# Patient Record
Sex: Female | Born: 1935 | Race: White | Hispanic: No | Smoking: Never smoker
Health system: Southern US, Community
[De-identification: ages and names within clinical notes are randomized; demographics above are authoritative.]

## PROBLEM LIST (undated history)

## (undated) DIAGNOSIS — E079 Disorder of thyroid, unspecified: Secondary | ICD-10-CM

## (undated) DIAGNOSIS — C801 Malignant (primary) neoplasm, unspecified: Secondary | ICD-10-CM

## (undated) DIAGNOSIS — H3530 Unspecified macular degeneration: Secondary | ICD-10-CM

## (undated) DIAGNOSIS — C4490 Unspecified malignant neoplasm of skin, unspecified: Secondary | ICD-10-CM

## (undated) HISTORY — DX: Disorder of thyroid, unspecified: E07.9

## (undated) HISTORY — PX: OTHER SURGICAL HISTORY: SHX169

## (undated) HISTORY — DX: Unspecified malignant neoplasm of skin, unspecified: C44.90

## (undated) HISTORY — DX: Unspecified macular degeneration: H35.30

## (undated) HISTORY — PX: CHOLECYSTECTOMY: SHX55

## (undated) HISTORY — DX: Malignant (primary) neoplasm, unspecified: C80.1

## (undated) HISTORY — PX: COLONOSCOPY: SHX174

---

## 1982-01-02 DIAGNOSIS — C801 Malignant (primary) neoplasm, unspecified: Secondary | ICD-10-CM

## 1982-01-02 HISTORY — PX: OVARY SURGERY: SHX727

## 1982-01-02 HISTORY — PX: VAGINAL HYSTERECTOMY: SUR661

## 1982-01-02 HISTORY — DX: Malignant (primary) neoplasm, unspecified: C80.1

## 2006-07-10 ENCOUNTER — Ambulatory Visit: Payer: Self-pay | Admitting: Ophthalmology

## 2006-07-10 ENCOUNTER — Other Ambulatory Visit: Payer: Self-pay

## 2006-07-16 ENCOUNTER — Ambulatory Visit: Payer: Self-pay | Admitting: Ophthalmology

## 2008-06-01 ENCOUNTER — Emergency Department: Payer: Self-pay | Admitting: Emergency Medicine

## 2015-01-13 ENCOUNTER — Ambulatory Visit: Payer: Self-pay | Admitting: General Surgery

## 2015-01-14 ENCOUNTER — Encounter: Payer: Self-pay | Admitting: General Surgery

## 2015-01-14 ENCOUNTER — Ambulatory Visit (INDEPENDENT_AMBULATORY_CARE_PROVIDER_SITE_OTHER): Payer: Medicare Other | Admitting: General Surgery

## 2015-01-14 VITALS — BP 124/58 | HR 86 | Resp 16 | Ht 64.0 in | Wt 115.0 lb

## 2015-01-14 DIAGNOSIS — K5641 Fecal impaction: Secondary | ICD-10-CM

## 2015-01-14 MED ORDER — HYDROCORTISONE ACE-PRAMOXINE 1-1 % RE CREA: 1.0000 "application " | TOPICAL_CREAM | Freq: Two times a day (BID) | RECTAL | Status: DC

## 2015-01-14 NOTE — Progress Notes (Signed)
Patient ID: Traci Vaughan, female   DOB: 1918/02/18, 82 y.o.   MRN: [id number]  Chief Complaint  Patient presents with  . Abdominal Pain    HPI Traci Vaughan is a 82 y.o. female.  Here today for evaluation of abdominal pain for 1 week and diarrhea for 2 weeks.  She states she has a history of going between diarrhea and constipation. She states she has diarrhea about 4-6 times a day, pudding consistency. Guiac positive test yesterday. The abdominal pain is described as more in the lower back and rectal area. No vomiting. Denies family history of colon cancer. No xrays. Pt has been using laxatives, miralax and stool softeners for a long time I have reviewed the history of present illness with the patient.   HPI  Past Medical History  Diagnosis Date  . Macular degeneration   . Cancer [facility]) 1984    ovarian /Dr Lottie Rater [facility]  . Skin cancer   . Thyroid disease     Past Surgical History  Procedure Laterality Date  . Ovary surgery  1984    [facility]  . Vaginal hysterectomy  1984  . Colonoscopy      History reviewed. No pertinent family history.  Social History Social History  Substance Use Topics  . Smoking status: Never Smoker   . Smokeless tobacco: Never Used  . Alcohol Use: No    No Known Allergies  Current Outpatient Prescriptions  Medication Sig Dispense Refill  . atorvastatin (LIPITOR) 10 MG tablet Take 10 mg by mouth daily at 6 PM.     . Biotin 1 MG CAPS Take by mouth daily.    . brimonidine (ALPHAGAN) 0.15 % ophthalmic solution     . Calcium Carbonate-Vit D-Min (CALCIUM 1200 PO) Take by mouth.    . Cholecalciferol (VITAMIN D-3 PO) Take by mouth.    . diphenoxylate-atropine (LOMOTIL) 2.5-0.025 MG tablet     . docusate sodium (COLACE) 100 MG capsule Take 100 mg by mouth daily.    . felodipine (PLENDIL) 5 MG 24 hr tablet Take 5 mg by mouth daily.     Marland Kitchen FIBER COMPLETE PO Take by mouth 2 (two) times daily.    . Magnesium Hydroxide (PHILLIPS PO) Take by mouth.    .  Multiple Vitamins-Minerals (EYE VITAMINS PO) Take by mouth 2 (two) times daily.    . Multiple Vitamins-Minerals (ZINC PO) Take by mouth.    Marland Kitchen PREMARIN 0.625 MG tablet Take 0.625 mg by mouth daily.     Marland Kitchen SYNTHROID 75 MCG tablet Take 75 mcg by mouth every other day.     . timolol (TIMOPTIC) 0.5 % ophthalmic solution     . triamterene-hydrochlorothiazide (MAXZIDE-25) 37.5-25 MG tablet Take 1 tablet by mouth daily.     . pramoxine-hydrocortisone (ANALPRAM-HC) 1-1 % rectal cream Place 1 application rectally 2 (two) times daily. 30 g 0   No current facility-administered medications for this visit.    Review of Systems Review of Systems  Constitutional: Negative.   Respiratory: Negative.   Cardiovascular: Negative.   Gastrointestinal: Positive for abdominal pain and diarrhea. Negative for nausea and vomiting.    Blood pressure 124/58, pulse 86, resp. rate 16, height 5\' 4"  (1.626 m), weight 115 lb (52.164 kg).  Physical Exam Physical Exam  Constitutional: She is oriented to person, place, and time. She appears well-developed and well-nourished.  HENT:  Mouth/Throat: Oropharynx is clear and moist.  Eyes: Conjunctivae are normal. No scleral icterus.  Neck: Neck supple.  Cardiovascular:  Normal rate, regular rhythm and normal heart sounds.   Pulmonary/Chest: Effort normal and breath sounds normal.  Abdominal: Soft. There is no tenderness.  Genitourinary: Rectal exam shows external hemorrhoid ( 25-32mm mildly edematous skin tags at 7 ocl) and tenderness. Rectal exam shows no internal hemorrhoid, no fissure, no mass and anal tone normal.  fecal impaction noted. Very large ball of fecal material in rectum-cannot get above it.   Lymphadenopathy:    She has no cervical adenopathy.  Neurological: She is alert and oriented to person, place, and time.  Skin: Skin is warm and dry.  Psychiatric: Her behavior is normal.    Data Reviewed Progress notes. Labs from yesterday- CBC normal. Creat is 2.0,  BUN 31.  Assessment    Fecal impaction.      Plan    Patient needs enemas. Likely will need several over a 3-4 days. If unsuccessful will need manual disimpaction. This information was provided to Dr. Hall Busing- he will arrange for home health nursing Analpram cream sample and RX. Avoid stool softeners Mild laxative as needed    Follow up as needed.  PCP:  Benita Stabile This information has been scribed by Karie Fetch [place].     SANKAR,SEEPLAPUTHUR G 01/15/2015, 9:12 AM

## 2015-01-14 NOTE — Patient Instructions (Addendum)
The patient is aware to call back for any questions or concerns. Needs enema Avoid stool softeners Mild laxative as needed  Fecal Impaction A fecal impaction happens when there is a large, firm amount of stool (or feces) that cannot be passed. The impacted stool is usually in the rectum, which is the lowest part of the large bowel. The impacted stool can block the colon and cause significant problems. CAUSES  The longer stool stays in the rectum, the harder it gets. Anything that slows down your bowel movements can lead to fecal impaction, such as:  Constipation. This can be a long-standing (chronic) problem or can happen suddenly (acute).  Painful conditions of the rectum, such as hemorrhoids or anal fissures. The pain of these conditions can make you try to avoid having bowel movements.  Narcotic pain-relieving medicines, such as methadone, morphine, or codeine.  Not drinking enough fluids.  Inactivity and bed rest over long periods of time.  Diseases of the brain or nervous system that damage the nerves controlling the muscles of the intestines. SIGNS AND SYMPTOMS   Lack of normal bowel movements or changes in bowel patterns.  Sense of fullness in the rectum but unable to pass stool.  Pain or cramps in the abdominal area (often after meals).  Thin, watery discharge from the rectum. DIAGNOSIS  Your health care provider may suspect that you have a fecal impaction based on your symptoms and a physical exam. This will include an exam of your rectum. Sometimes X-rays or lab testing may be needed to confirm the diagnosis and to be sure there are no other problems.  TREATMENT   Initially an impaction can be removed manually. Using a gloved finger, your health care provider can remove hard stool from your rectum.  Medicine is sometimes needed. A suppository or enema can be given in the rectum to soften the stool, which can stimulate a bowel movement. Medicines can also be given by  mouth (orally).  Though rare, surgery may be needed if the colon has torn (perforated) due to blockage. HOME CARE INSTRUCTIONS   Develop regular bowel habits. This could include getting in the habit of having a bowel movement after your morning cup of coffee or after eating. Be sure to allow yourself enough time on the toilet.  Maintain a high-fiber diet.  Drink enough fluids to keep your urine clear or pale yellow as directed by your health care provider.  Exercise regularly.  If you begin to get constipated, increase the amount of fiber in your diet. Eat plenty of fruits, vegetables, whole wheat breads, bran, oatmeal, and similar products.  Take natural fiber laxatives or other laxatives only as directed by your health care provider. SEEK MEDICAL CARE IF:   You have ongoing rectal pain.  You require enemas or suppositories more than twice a week.  You have rectal bleeding.  You have continued problems, or you develop abdominal pain.  You have thin, pencil-like stools. SEEK IMMEDIATE MEDICAL CARE IF:  You have black or tarry stools. MAKE SURE YOU:   Understand these instructions.  Will watch your condition.  Will get help right away if you are not doing well or get worse.   This information is not intended to replace advice given to you by your health care provider. Make sure you discuss any questions you have with your health care provider.   Document Released: 09/11/2003 Document Revised: 10/09/2012 Document Reviewed: 06/25/2012 Elsevier Interactive Patient Education [employer].

## 2015-01-15 ENCOUNTER — Emergency Department
Admission: EM | Admit: 2015-01-15 | Discharge: 2015-01-15 | Disposition: A | Discharge status: Home / Self Care | Payer: Medicare Other | Attending: Emergency Medicine | Admitting: Emergency Medicine

## 2015-01-15 ENCOUNTER — Encounter: Payer: Self-pay | Admitting: General Surgery

## 2015-01-15 ENCOUNTER — Emergency Department: Payer: Medicare Other

## 2015-01-15 DIAGNOSIS — Z79899 Other long term (current) drug therapy: Secondary | ICD-10-CM | POA: Diagnosis not present

## 2015-01-15 DIAGNOSIS — K5641 Fecal impaction: Secondary | ICD-10-CM | POA: Diagnosis not present

## 2015-01-15 DIAGNOSIS — N189 Chronic kidney disease, unspecified: Secondary | ICD-10-CM

## 2015-01-15 DIAGNOSIS — R109 Unspecified abdominal pain: Secondary | ICD-10-CM

## 2015-01-15 DIAGNOSIS — K6289 Other specified diseases of anus and rectum: Secondary | ICD-10-CM | POA: Diagnosis present

## 2015-01-15 LAB — COMPREHENSIVE METABOLIC PANEL WITH GFR
ALT: 14 U/L (ref 14–54)
AST: 15 U/L (ref 15–41)
Albumin: 3.6 g/dL (ref 3.5–5.0)
Alkaline Phosphatase: 53 U/L (ref 38–126)
Anion gap: 8 (ref 5–15)
BUN: 35 mg/dL — ABNORMAL HIGH (ref 6–20)
CO2: 29 mmol/L (ref 22–32)
Calcium: 9.4 mg/dL (ref 8.9–10.3)
Chloride: 97 mmol/L — ABNORMAL LOW (ref 101–111)
Creatinine, Ser: 1.76 mg/dL — ABNORMAL HIGH (ref 0.44–1.00)
GFR calc Af Amer: 27 mL/min — ABNORMAL LOW (ref >60)
GFR calc non Af Amer: 24 mL/min — ABNORMAL LOW (ref >60)
Glucose, Bld: 96 mg/dL (ref 65–99)
Potassium: 3.8 mmol/L (ref 3.5–5.1)
Sodium: 134 mmol/L — ABNORMAL LOW (ref 135–145)
Total Bilirubin: 0.1 mg/dL — ABNORMAL LOW (ref 0.3–1.2)
Total Protein: 6.8 g/dL (ref 6.5–8.1)

## 2015-01-15 LAB — CBC
HCT: 38.5 % (ref 35.0–47.0)
Hemoglobin: 12.7 g/dL (ref 12.0–16.0)
MCH: 28.5 pg (ref 26.0–34.0)
MCHC: 33 g/dL (ref 32.0–36.0)
MCV: 86.5 fL (ref 80.0–100.0)
PLATELETS: 220 10*3/uL (ref 150–440)
RBC: 4.45 MIL/uL (ref 3.80–5.20)
RDW: 15.2 % — AB (ref 11.5–14.5)
WBC: 9.3 10*3/uL (ref 3.6–11.0)

## 2015-01-15 LAB — LIPASE, BLOOD: Lipase: 37 U/L (ref 11–51)

## 2015-01-15 MED ORDER — SODIUM CHLORIDE 0.9 % IV BOLUS (SEPSIS)
1000.0000 mL | Freq: Once | INTRAVENOUS | Status: AC
  Administered 2015-01-15: 1000 mL via INTRAVENOUS

## 2015-01-15 NOTE — Discharge Instructions (Signed)
You were seen in the emergency room for abdominal pain. It is important that you follow up closely with your primary care doctor in the next 5-6 days.  Please return to the emergency room right away if you are to develop a fever, severe nausea, your pain becomes severe or worsens, you are unable to keep food down, begin vomiting any dark or bloody fluid, you develop any dark or bloody stools, feel dehydrated, or other new concerns or symptoms arise. Please continue to take your MiraLAX and laxatives regularly.  Follow up closely with Dr. Hall Busing.   Fecal Impaction A fecal impaction happens when there is a large, firm amount of stool (or feces) that cannot be passed. The impacted stool is usually in the rectum, which is the lowest part of the large bowel. The impacted stool can block the colon and cause significant problems. CAUSES  The longer stool stays in the rectum, the harder it gets. Anything that slows down your bowel movements can lead to fecal impaction, such as:  Constipation. This can be a long-standing (chronic) problem or can happen suddenly (acute).  Painful conditions of the rectum, such as hemorrhoids or anal fissures. The pain of these conditions can make you try to avoid having bowel movements.  Narcotic pain-relieving medicines, such as methadone, morphine, or codeine.  Not drinking enough fluids.  Inactivity and bed rest over long periods of time.  Diseases of the brain or nervous system that damage the nerves controlling the muscles of the intestines. SIGNS AND SYMPTOMS   Lack of normal bowel movements or changes in bowel patterns.  Sense of fullness in the rectum but unable to pass stool.  Pain or cramps in the abdominal area (often after meals).  Thin, watery discharge from the rectum. DIAGNOSIS  Your health care provider may suspect that you have a fecal impaction based on your symptoms and a physical exam. This will include an exam of your rectum. Sometimes  X-rays or lab testing may be needed to confirm the diagnosis and to be sure there are no other problems.  TREATMENT   Initially an impaction can be removed manually. Using a gloved finger, your health care provider can remove hard stool from your rectum.  Medicine is sometimes needed. A suppository or enema can be given in the rectum to soften the stool, which can stimulate a bowel movement. Medicines can also be given by mouth (orally).  Though rare, surgery may be needed if the colon has torn (perforated) due to blockage. HOME CARE INSTRUCTIONS   Develop regular bowel habits. This could include getting in the habit of having a bowel movement after your morning cup of coffee or after eating. Be sure to allow yourself enough time on the toilet.  Maintain a high-fiber diet.  Drink enough fluids to keep your urine clear or pale yellow as directed by your health care provider.  Exercise regularly.  If you begin to get constipated, increase the amount of fiber in your diet. Eat plenty of fruits, vegetables, whole wheat breads, bran, oatmeal, and similar products.  Take natural fiber laxatives or other laxatives only as directed by your health care provider. SEEK MEDICAL CARE IF:   You have ongoing rectal pain.  You require enemas or suppositories more than twice a week.  You have rectal bleeding.  You have continued problems, or you develop abdominal pain.  You have thin, pencil-like stools. SEEK IMMEDIATE MEDICAL CARE IF:  You have black or tarry stools. MAKE SURE YOU:  Understand these instructions.  Will watch your condition.  Will get help right away if you are not doing well or get worse.   This information is not intended to replace advice given to you by your health care provider. Make sure you discuss any questions you have with your health care provider.   Document Released: 09/11/2003 Document Revised: 10/09/2012 Document Reviewed: 06/25/2012 Elsevier Interactive  Patient Education [employer].

## 2015-01-15 NOTE — ED Notes (Signed)
Patient transported to X-ray 

## 2015-01-15 NOTE — ED Notes (Signed)
Pt continues to have foul odor liquid brown stool, pt cleaned and changed

## 2015-01-15 NOTE — ED Notes (Signed)
Pt states she was seen at Dr. Jamal Collin yesterday and told she has a fecal impaction and to come to the ED for treatment, pt c/o increased rectal pain pressure over the past 2 days.

## 2015-01-15 NOTE — ED Notes (Signed)
Soap suds enema given, small brown pellets, pt given call bell and instructed to call when she feels the urge

## 2015-01-15 NOTE — ED Provider Notes (Addendum)
[facility] Emergency Department Provider Note  ____________________________________________  Time seen: Approximately 12:24 PM  I have reviewed the triage vital signs and the nursing notes.   HISTORY  Chief Complaint Fecal Impaction    HPI Traci Vaughan is a 82 y.o. female who presents for evaluation of inability to pass her bowels.  Patient reports that she's been constipated for about a week, occasionally passing liquid but having a lot of pressure around the rectum. She saw general surgery yesterday who diagnosed her with a fecal impaction and advised frequent and serial enemas, but was unable to have this arranged as an outpatient.  She reports that she has pain in or around the rectum especially when she has to sit down or walk. She is not a cerebrovascular blood in her stool but reports her stool is liquid and she feels like she cannot defecate because it is blocked.  No fevers chills chest pain nausea or vomiting. She has been eating slightly less over the last few days and feels slightly dehydrated.   Past Medical History  Diagnosis Date  . Macular degeneration   . Cancer [facility]) 1984    ovarian /Dr Lottie Rater [facility]  . Skin cancer   . Thyroid disease     There are no active problems to display for this patient.   Past Surgical History  Procedure Laterality Date  . Ovary surgery  1984    [facility]  . Vaginal hysterectomy  1984  . Colonoscopy      Current Outpatient Rx  Name  Route  Sig  Dispense  Refill  . atorvastatin (LIPITOR) 10 MG tablet   Oral   Take 10 mg by mouth daily at 6 PM.          . Biotin 1 MG CAPS   Oral   Take by mouth daily.         . brimonidine (ALPHAGAN) 0.15 % ophthalmic solution               . Calcium Carbonate-Vit D-Min (CALCIUM 1200 PO)   Oral   Take by mouth.         . Cholecalciferol (VITAMIN D-3 PO)   Oral   Take by mouth.         . diphenoxylate-atropine (LOMOTIL) 2.5-0.025 MG tablet              . docusate sodium (COLACE) 100 MG capsule   Oral   Take 100 mg by mouth daily.         . felodipine (PLENDIL) 5 MG 24 hr tablet   Oral   Take 5 mg by mouth daily.          Marland Kitchen FIBER COMPLETE PO   Oral   Take by mouth 2 (two) times daily.         . Magnesium Hydroxide (PHILLIPS PO)   Oral   Take by mouth.         . Multiple Vitamins-Minerals (EYE VITAMINS PO)   Oral   Take by mouth 2 (two) times daily.         . Multiple Vitamins-Minerals (ZINC PO)   Oral   Take by mouth.         . pramoxine-hydrocortisone (ANALPRAM-HC) 1-1 % rectal cream   Rectal   Place 1 application rectally 2 (two) times daily.   30 g   0   . PREMARIN 0.625 MG tablet   Oral   Take 0.625 mg by  mouth daily.            Dispense as written.   Marland Kitchen SYNTHROID 75 MCG tablet   Oral   Take 75 mcg by mouth every other day.            Dispense as written.   . timolol (TIMOPTIC) 0.5 % ophthalmic solution               . triamterene-hydrochlorothiazide (MAXZIDE-25) 37.5-25 MG tablet   Oral   Take 1 tablet by mouth daily.            Allergies Review of patient's allergies indicates no known allergies.  No family history on file.  Social History Social History  Substance Use Topics  . Smoking status: Never Smoker   . Smokeless tobacco: Never Used  . Alcohol Use: No    Review of Systems Constitutional: No fever/chills Eyes: No visual changes. ENT: No sore throat. Cardiovascular: Denies chest pain. Respiratory: Denies shortness of breath. Gastrointestinal: See history of present illness Genitourinary: Negative for dysuria. Musculoskeletal: Negative for back pain. Skin: Negative for rash. Neurological: Negative for headaches, focal weakness or numbness.  10-point ROS otherwise negative.  ____________________________________________   PHYSICAL EXAM:  VITAL SIGNS: ED Triage Vitals  Enc Vitals Group     BP 01/15/15 1112 113/58 mmHg     Pulse Rate  01/15/15 1112 73     Resp 01/15/15 1112 16     Temp 01/15/15 1112 97.5 F (36.4 C)     Temp Source 01/15/15 1112 Oral     SpO2 01/15/15 1112 100 %     Weight 01/15/15 1112 113 lb (51.256 kg)     Height 01/15/15 1112 5\' 4"  (1.626 m)     Head Cir --      Peak Flow --      Pain Score 01/15/15 1113 8     Pain Loc --      Pain Edu? --      Excl. in [place]? --    Constitutional: Alert and oriented. Well appearing and in no acute distress. Eyes: Conjunctivae are normal. PERRL. EOMI. Head: Atraumatic. Nose: No congestion/rhinnorhea. Mouth/Throat: Mucous membranes are slightly dry.  Oropharynx non-erythematous. Neck: No stridor.   Cardiovascular: Normal rate, regular rhythm. Grossly normal heart sounds.  Good peripheral circulation. Respiratory: Normal respiratory effort.  No retractions. Lungs CTAB. Gastrointestinal: Soft and nontender except for some mild soft distention left side of the abdomen without rebound or guarding. No abdominal bruits. Rectal exam demonstrates distal stool, which I am unable to reach beyond my finger. It is dark and nonbloody. The patient does have an exam that seems to suggest fecal impaction. Musculoskeletal: No lower extremity tenderness nor edema.  No joint effusions. Neurologic:  Normal speech and language. No gross focal neurologic deficits are appreciated. Skin:  Skin is warm, dry and intact. No rash noted. Psychiatric: Mood and affect are normal. Speech and behavior are normal.  ____________________________________________   LABS (all labs ordered are listed, but only abnormal results are displayed)  Labs Reviewed  COMPREHENSIVE METABOLIC PANEL - Abnormal; Notable for the following:    Sodium 134 (*)    Chloride 97 (*)    BUN 35 (*)    Creatinine, Ser 1.76 (*)    Total Bilirubin 0.1 (*)    GFR calc non Af Amer 24 (*)    GFR calc Af Amer 27 (*)    All other components within normal limits  CBC - Abnormal; Notable for the  following:    RDW 15.2 (*)     All other components within normal limits  LIPASE, BLOOD  URINALYSIS COMPLETEWITH MICROSCOPIC ([facility] ONLY)   ____________________________________________  EKG   ____________________________________________  RADIOLOGY  DG Abd 2 Views (Final result) Result time: 01/15/15 11:58:48   Final result by Rad Results In Interface (01/15/15 11:58:48)   Narrative:   CLINICAL DATA: 82 year old female with abdomen and pelvic pain for several months. Initial encounter. Personal history of ovarian cancer, hysterectomy, cholecystectomy.  EXAM: ABDOMEN - 2 VIEW  COMPARISON: None.  FINDINGS: Upright and supine views of the abdomen and pelvis. Numerous surgical clips throughout the abdomen and along the pelvic sidewalls. Non obstructed bowel gas pattern. Retained stool which appears to be mixed with oral contrast throughout the colon, most pronounced in the rectum. No pneumoperitoneum. Negative lung bases. Abdominal and pelvic visceral contours are within normal limits. Osteopenia. Aortoiliac calcified atherosclerosis noted. Pelvic phleboliths. Suggestion of mild upper lumbar compression fractures. No acute osseous abnormality identified.  IMPRESSION: Normal bowel gas pattern, no free air. Moderate volume of retained stool in the colon including the rectum.   Electronically Signed By: Genevie Ann M.D. On: 01/15/2015 11:58    ____________________________________________   PROCEDURES  Procedure(s) performed: None  Critical Care performed: No  ____________________________________________   INITIAL IMPRESSION / ASSESSMENT AND PLAN / ED COURSE  Pertinent labs & imaging results that were available during my care of the patient were reviewed by me and considered in my medical decision making (see chart for details).  Inability to defecate. Patient with obvious fecal impaction, with associated constipation. Unable to disimpact manually. We will initiate enemas, have reviewed  general surgery notes which I also advised frequent and regular enemas. She does not appear to be septic, toxic, does not have evidence of peritonitis on exam. We will give an enema and reevaluate for symptom back improvement. At the present time she is not having any nausea or vomiting or symptoms of small bowel obstruction.  2PM d/W Dr. Hall Busing, Patient baseline Cr 1.6-2.0 over the last month in his office with GFR just less than 30. Dr. Hall Busing will follow-up closely outpatient. Can see patient mid-week.  Careful discharge instructions discussed with patient and her son [name]. They will follow-up closely with Dr. Hall Busing.  Return precautions and treatment recommendations and follow-up discussed with the patient who is agreeable with the plan.    ____________________________________________   FINAL CLINICAL IMPRESSION(S) / ED DIAGNOSES  Final diagnoses:  Fecal impaction ([place])  Chronic renal insufficiency, unspecified stage      Delman Kitten, MD 01/15/15 1409  Please note the patient excellent bowel movement and all symptoms are resolved after enema. She large nonbloody formed stool after enema. All symptoms and pain resolved. Resolution of impaction.  Delman Kitten, MD 01/15/15 1409

## 2015-06-16 ENCOUNTER — Encounter: Payer: Self-pay | Admitting: Emergency Medicine

## 2015-06-16 ENCOUNTER — Emergency Department: Payer: Medicare Other

## 2015-06-16 ENCOUNTER — Emergency Department
Admission: EM | Admit: 2015-06-16 | Discharge: 2015-06-16 | Disposition: A | Discharge status: Home / Self Care | Payer: Medicare Other | Attending: Emergency Medicine | Admitting: Emergency Medicine

## 2015-06-16 DIAGNOSIS — S82831A Other fracture of upper and lower end of right fibula, initial encounter for closed fracture: Secondary | ICD-10-CM | POA: Diagnosis not present

## 2015-06-16 DIAGNOSIS — Y939 Activity, unspecified: Secondary | ICD-10-CM | POA: Diagnosis not present

## 2015-06-16 DIAGNOSIS — S82391A Other fracture of lower end of right tibia, initial encounter for closed fracture: Secondary | ICD-10-CM | POA: Insufficient documentation

## 2015-06-16 DIAGNOSIS — Z79899 Other long term (current) drug therapy: Secondary | ICD-10-CM | POA: Diagnosis not present

## 2015-06-16 DIAGNOSIS — Z85828 Personal history of other malignant neoplasm of skin: Secondary | ICD-10-CM | POA: Diagnosis not present

## 2015-06-16 DIAGNOSIS — Y92009 Unspecified place in unspecified non-institutional (private) residence as the place of occurrence of the external cause: Secondary | ICD-10-CM | POA: Insufficient documentation

## 2015-06-16 DIAGNOSIS — Y999 Unspecified external cause status: Secondary | ICD-10-CM | POA: Insufficient documentation

## 2015-06-16 DIAGNOSIS — S99911A Unspecified injury of right ankle, initial encounter: Secondary | ICD-10-CM | POA: Diagnosis present

## 2015-06-16 DIAGNOSIS — W19XXXA Unspecified fall, initial encounter: Secondary | ICD-10-CM | POA: Insufficient documentation

## 2015-06-16 DIAGNOSIS — S82201A Unspecified fracture of shaft of right tibia, initial encounter for closed fracture: Secondary | ICD-10-CM

## 2015-06-16 DIAGNOSIS — Z8543 Personal history of malignant neoplasm of ovary: Secondary | ICD-10-CM | POA: Diagnosis not present

## 2015-06-16 DIAGNOSIS — S82401A Unspecified fracture of shaft of right fibula, initial encounter for closed fracture: Secondary | ICD-10-CM

## 2015-06-16 LAB — CBC WITH DIFFERENTIAL/PLATELET
Basophils Absolute: 0 10*3/uL (ref 0–0.1)
Basophils Relative: 0 %
Eosinophils Absolute: 0 10*3/uL (ref 0–0.7)
Eosinophils Relative: 0 %
HCT: 39.7 % (ref 35.0–47.0)
HEMOGLOBIN: 13.2 g/dL (ref 12.0–16.0)
LYMPHS ABS: 1.4 10*3/uL (ref 1.0–3.6)
LYMPHS PCT: 16 %
MCH: 28.4 pg (ref 26.0–34.0)
MCHC: 33.1 g/dL (ref 32.0–36.0)
MCV: 85.7 fL (ref 80.0–100.0)
Monocytes Absolute: 0.5 10*3/uL (ref 0.2–0.9)
Monocytes Relative: 5 %
NEUTROS ABS: 7 10*3/uL — AB (ref 1.4–6.5)
NEUTROS PCT: 79 %
Platelets: 143 10*3/uL — ABNORMAL LOW (ref 150–440)
RBC: 4.64 MIL/uL (ref 3.80–5.20)
RDW: 15.4 % — ABNORMAL HIGH (ref 11.5–14.5)
WBC: 9 10*3/uL (ref 3.6–11.0)

## 2015-06-16 LAB — BASIC METABOLIC PANEL
Anion gap: 12 (ref 5–15)
BUN: 42 mg/dL — ABNORMAL HIGH (ref 6–20)
CHLORIDE: 99 mmol/L — AB (ref 101–111)
CO2: 25 mmol/L (ref 22–32)
Calcium: 9.3 mg/dL (ref 8.9–10.3)
Creatinine, Ser: 1.87 mg/dL — ABNORMAL HIGH (ref 0.44–1.00)
GFR calc non Af Amer: 22 mL/min — ABNORMAL LOW (ref >60)
GFR, EST AFRICAN AMERICAN: 25 mL/min — AB (ref >60)
Glucose, Bld: 109 mg/dL — ABNORMAL HIGH (ref 65–99)
POTASSIUM: 3.5 mmol/L (ref 3.5–5.1)
SODIUM: 136 mmol/L (ref 135–145)

## 2015-06-16 LAB — URINALYSIS COMPLETE WITH MICROSCOPIC (ARMC ONLY)
Bilirubin Urine: NEGATIVE
GLUCOSE, UA: NEGATIVE mg/dL
Hgb urine dipstick: NEGATIVE
Ketones, ur: NEGATIVE mg/dL
LEUKOCYTES UA: NEGATIVE
NITRITE: NEGATIVE
Protein, ur: NEGATIVE mg/dL
Specific Gravity, Urine: 1.015 (ref 1.005–1.030)
pH: 5 (ref 5.0–8.0)

## 2015-06-16 MED ORDER — NAPROXEN 500 MG PO TABS
500.0000 mg | ORAL_TABLET | Freq: Two times a day (BID) | ORAL | Status: DC
  Administered 2015-06-16: 500 mg via ORAL
  Filled 2015-06-16: qty 1

## 2015-06-16 MED ORDER — HYDROCODONE-ACETAMINOPHEN 5-325 MG PO TABS: 1.0000 | ORAL_TABLET | ORAL | Status: DC | PRN

## 2015-06-16 NOTE — ED Notes (Signed)
This RN spoke with Traci Vaughan at [facility] to let her know that pt will be coming by ems. Pt in nad at time of discharge.

## 2015-06-16 NOTE — ED Notes (Signed)
This Probation officer spoke with the Education officer, [facility] for this [facility] who is covering for the ED today. Social Worker reports that pt needs to be seen by PT initially before Social Consult can be completed. PT consult ordered. Pt with fracture to right lower leg and cannot stand.  Son spoke with social worker at [facility] and pt has been accepted there for rehab, but needs the [facility] form signed from the edp. [place] social worker aware that pt's son called [facility] for placement.  Pt in nad, vss.

## 2015-06-16 NOTE — ED Notes (Signed)
EMS notified of need for transport to [facility]

## 2015-06-16 NOTE — ED Provider Notes (Addendum)
[facility] Emergency Department Provider Note   ____________________________________________  Time seen:  I have reviewed the triage vital signs and the triage nursing note.  HISTORY  Chief Complaint Ankle Pain and Leg Injury   Historian Patient  HPI Traci Vaughan is a 81 y.o. female who is in relatively great health and lives at home alone, reports that she may have had a fall early morning/overnight and this morning is having trouble walking on her right lower extremity. She did come in by EMS. Pain is moderate to severe when she tries to walk, but at rest is minimal. Made worse by movement. No other injuries. No confusion altered mental status. No head injury. No neck pain. No chest pain or cough or trouble breathing. No abdominal pain.    Past Medical History  Diagnosis Date  . Macular degeneration   . Cancer [facility]) 1984    ovarian /Dr Lottie Rater [facility]  . Skin cancer   . Thyroid disease     There are no active problems to display for this patient.   Past Surgical History  Procedure Laterality Date  . Ovary surgery  1984    [facility]  . Vaginal hysterectomy  1984  . Colonoscopy      Current Outpatient Rx  Name  Route  Sig  Dispense  Refill  . atorvastatin (LIPITOR) 10 MG tablet   Oral   Take 10 mg by mouth daily at 6 PM.          . Biotin 1 MG CAPS   Oral   Take by mouth daily.         . brimonidine (ALPHAGAN) 0.15 % ophthalmic solution   Both Eyes   Place 1 drop into both eyes 2 (two) times daily.          . Calcium Carbonate-Vit D-Min (CALCIUM 1200 PO)   Oral   Take 1 tablet by mouth daily.          . Cholecalciferol (VITAMIN D-3 PO)   Oral   Take 1 tablet by mouth daily.          . Cyanocobalamin (VITAMIN B 12 PO)   Oral   Take 1 tablet by mouth daily.         . felodipine (PLENDIL) 5 MG 24 hr tablet   Oral   Take 5 mg by mouth daily.          . Multiple Vitamins-Minerals (EYE VITAMINS PO)   Oral   Take by  mouth 2 (two) times daily.         . Multiple Vitamins-Minerals (ZINC PO)   Oral   Take 1 tablet by mouth daily.          Marland Kitchen PREMARIN 0.625 MG tablet   Oral   Take 0.625 mg by mouth daily.            Dispense as written.   Marland Kitchen SYNTHROID 75 MCG tablet   Oral   Take 75 mcg by mouth every other day. Mondays, Wednesdays, and Fridays.           Dispense as written.   . timolol (TIMOPTIC) 0.5 % ophthalmic solution   Both Eyes   Place 1 drop into both eyes 2 (two) times daily.          Marland Kitchen triamterene-hydrochlorothiazide (MAXZIDE-25) 37.5-25 MG tablet   Oral   Take 1 tablet by mouth daily.          Marland Kitchen HYDROcodone-acetaminophen (  NORCO/VICODIN) 5-325 MG tablet   Oral   Take 1 tablet by mouth every 4 (four) hours as needed for moderate pain or severe pain.   30 tablet   0     Allergies Review of patient's allergies indicates no known allergies.  History reviewed. No pertinent family history.  Social History Social History  Substance Use Topics  . Smoking status: Never Smoker   . Smokeless tobacco: Never Used  . Alcohol Use: No    Review of Systems  Constitutional: Negative for fever. Eyes: Negative for visual changes. ENT: Negative for sore throat. Cardiovascular: Negative for chest pain. Respiratory: Negative for shortness of breath. Gastrointestinal: Negative for abdominal pain, vomiting and diarrhea. Genitourinary: Negative for dysuria. Musculoskeletal: Negative for back pain. Skin: Negative for rash. Neurological: Negative for headache. 10 point Review of Systems otherwise negative ____________________________________________   PHYSICAL EXAM:  VITAL SIGNS: ED Triage Vitals  Enc Vitals Group     BP 06/16/15 1000 165/64 mmHg     Pulse Rate 06/16/15 0950 67     Resp 06/16/15 0950 20     Temp 06/16/15 0950 97.8 F (36.6 C)     Temp Source 06/16/15 0950 Oral     SpO2 06/16/15 0950 100 %     Weight 06/16/15 0950 106 lb (48.081 kg)     Height  06/16/15 0950 5\' 4"  (1.626 m)     Head Cir --      Peak Flow --      Pain Score 06/16/15 0945 8     Pain Loc --      Pain Edu? --      Excl. in [place]? --      Constitutional: Alert and oriented. Well appearing and in no distress. HEENT   Head: Normocephalic and atraumatic.      Eyes: Conjunctivae are normal. PERRL. Normal extraocular movements.      Ears:         Nose: No congestion/rhinnorhea.   Mouth/Throat: Mucous membranes are moist.   Neck: No stridor. Cardiovascular/Chest: Normal rate, regular rhythm.  No murmurs, rubs, or gallops. Respiratory: Normal respiratory effort without tachypnea nor retractions. Breath sounds are clear and equal bilaterally. No wheezes/rales/rhonchi. Gastrointestinal: Soft. No distention, no guarding, no rebound. Nontender.    Genitourinary/rectal:Deferred Musculoskeletal: Mild swelling to the distal tibia with tenderness to palpation or any range of motion of the ankle. Dorsalis pedis/foot pulses are intact. Neurologic:  Normal speech and language. No gross or focal neurologic deficits are appreciated. Skin:  Skin is warm, dry and intact. No rash noted. Psychiatric: Mood and affect are normal. Speech and behavior are normal. Patient exhibits appropriate insight and judgment.  ____________________________________________   EKG I, Lisa Roca, MD, the attending physician have personally viewed and interpreted all ECGs.  63 bpm. Normal sinus rhythm. Narrow QRS. Normal axis. Normal ST and T-wave ____________________________________________  LABS (pertinent positives/negatives)  Labs Reviewed  URINALYSIS COMPLETEWITH MICROSCOPIC ([facility] ONLY) - Abnormal; Notable for the following:    Color, Urine YELLOW (*)    APPearance CLEAR (*)    Bacteria, UA MANY (*)    Squamous Epithelial / LPF 0-5 (*)    All other components within normal limits  BASIC METABOLIC PANEL - Abnormal; Notable for the following:    Chloride 99 (*)    Glucose, Bld 109  (*)    BUN 42 (*)    Creatinine, Ser 1.87 (*)    GFR calc non Af Amer 22 (*)    GFR calc  Af Amer 25 (*)    All other components within normal limits  CBC WITH DIFFERENTIAL/PLATELET - Abnormal; Notable for the following:    RDW 15.4 (*)    Platelets 143 (*)    Neutro Abs 7.0 (*)    All other components within normal limits    ____________________________________________  RADIOLOGY All Xrays were viewed by me. Imaging interpreted by Radiologist.  Right foot: IMPRESSION: 1. Distal right tibia fracture, see comparison. 2. No superimposed acute fracture or dislocation identified in the right foot.  Tibia and fibula right:  1. Comminuted spiral fracture of the distal right tibia meta diaphysis appears to be extra-articular, with mild lateral displacement and slight over-riding. 2. Comminuted spiral fracture of the proximal right fibula meta diaphysis with mild displacement.  __________________________________________  PROCEDURES  Procedure(s) performed: Splint, applied by tech. Posterior short leg on the right. Neurovascularly intact after the procedure, reviewed by me.  Critical Care performed: None  ____________________________________________   ED COURSE / ASSESSMENT AND PLAN  Pertinent labs & imaging results that were available during my care of the patient were reviewed by me and considered in my medical decision making (see chart for details).   She is here for trauma evaluation, with ankle pain. Her x-rays confirm a spiral distal tibial fracture and fracture of the fibula. She is neurovascularly intact. She denies any additional pain or injury and I don't find any other evidence of injury on exam. It is unclear why she fell, but it doesn't sound like she had a syncopal event or any cardiopulmonary symptoms. Her urinalysis is reassuring although I did send a urine culture. Her BUN and creatinine are minimally elevated and similar to prior baseline.  I spoke with  orthopedic physician, Dr. Rudene Christians who recommends no weightbearing to that leg, leave in a splint and make appointment for an office visit on Monday where she would likely be converted to a cast for up to 3 months. Given her age surgical repair was felt to be less beneficial than attempting nonoperative management.  Patient lives alone and is now nonweightbearing to her lower extremity and she is unable to home by herself. Social work was consulted and she will be discharged. Nursing/rehabilitation facility.  I did send order for Tylenol for mild pain, as well as Norco for moderate to severe pain.  She has not required any narcotic IV dosing here.     CONSULTATIONS:   Orthopedics, Dr. Rudene Christians - recommends nonoperative management at this point time. Social work, coordinated nursing home placement.   Patient / Family / Caregiver informed of clinical course, medical decision-making process, and agree with plan.   I discussed return precautions, follow-up instructions, and discharged instructions with patient and/or family.  Addended to include that I did additionally add an order for Aleve/Naprosyn. Patient states that this which would typically take at home. ___________________________________________   FINAL CLINICAL IMPRESSION(S) / ED DIAGNOSES   Final diagnoses:  Right tibial fracture, closed, initial encounter  Right fibular fracture, closed, initial encounter              Note: This dictation was prepared with Dragon dictation. Any transcriptional errors that result from this process are unintentional   Lisa Roca, MD 06/16/15 [place], MD 06/16/15 [phone]

## 2015-06-16 NOTE — NC FL2 (Signed)
  [place] LEVEL OF CARE SCREENING TOOL     IDENTIFICATION  Patient Name: Traci Vaughan Birthdate: 01/18/1918 Sex: female Admission Date (Current Location): 06/16/2015  [place] and [place] Number:  Engineering geologist and Address:  [facility], [address], [place], [place] [address]      Provider Number: [id number]  Attending Physician Name and Address:  Lisa Roca, MD  Relative Name and Phone Number:       Current Level of Care: Hospital Recommended Level of Care: [place] Prior Approval Number:    Date Approved/Denied:   PASRR Number: [id number]  Discharge Plan: SNF    Current Diagnoses: There are no active problems to display for this patient.   Orientation RESPIRATION BLADDER Height & Weight     Self, Situation, Place  Normal Continent Weight: 106 lb (48.081 kg) Height:  5\' 4"  (162.6 cm)  BEHAVIORAL SYMPTOMS/MOOD NEUROLOGICAL BOWEL NUTRITION STATUS   (none)  (none) Continent Diet  AMBULATORY STATUS COMMUNICATION OF NEEDS Skin   Extensive Assist Verbally Normal                       Personal Care Assistance Level of Assistance  Bathing, Feeding, Dressing Bathing Assistance: Limited assistance Feeding assistance: Limited assistance Dressing Assistance: Limited assistance     Functional Limitations Info  Sight, Hearing Sight Info: Impaired Hearing Info: Impaired      SPECIAL CARE FACTORS FREQUENCY  PT (By licensed PT)                    Contractures Contractures Info: Not present    Additional Factors Info  Code Status Code Status Info: Not on file             Current Medications (06/16/2015):  This is the current hospital active medication list No current facility-administered medications for this encounter.   Current Outpatient Prescriptions  Medication Sig Dispense Refill  . atorvastatin (LIPITOR) 10 MG tablet Take 10 mg by mouth daily at 6 PM.     . Biotin 1 MG  CAPS Take by mouth daily.    . brimonidine (ALPHAGAN) 0.15 % ophthalmic solution Place 1 drop into both eyes 2 (two) times daily.     . Calcium Carbonate-Vit D-Min (CALCIUM 1200 PO) Take 1 tablet by mouth daily.     . Cholecalciferol (VITAMIN D-3 PO) Take 1 tablet by mouth daily.     . Cyanocobalamin (VITAMIN B 12 PO) Take 1 tablet by mouth daily.    . felodipine (PLENDIL) 5 MG 24 hr tablet Take 5 mg by mouth daily.     . Multiple Vitamins-Minerals (EYE VITAMINS PO) Take by mouth 2 (two) times daily.    . Multiple Vitamins-Minerals (ZINC PO) Take 1 tablet by mouth daily.     Marland Kitchen PREMARIN 0.625 MG tablet Take 0.625 mg by mouth daily.     Marland Kitchen SYNTHROID 75 MCG tablet Take 75 mcg by mouth every other day. Mondays, Wednesdays, and Fridays.    Marland Kitchen timolol (TIMOPTIC) 0.5 % ophthalmic solution Place 1 drop into both eyes 2 (two) times daily.     Marland Kitchen triamterene-hydrochlorothiazide (MAXZIDE-25) 37.5-25 MG tablet Take 1 tablet by mouth daily.        Discharge Medications: Please see discharge summary for a list of discharge medications.  Relevant Imaging Results:  Relevant Lab Results:   Additional Information ss: [id number]  Shela Leff, LCSW

## 2015-06-16 NOTE — Clinical Social Work Placement (Signed)
   CLINICAL SOCIAL WORK PLACEMENT  NOTE  Date:  06/16/2015  Patient Details  Name: Traci Vaughan MRN: [id number] Date of Birth: [birth year]  Clinical Social Work is seeking post-discharge placement for this patient at the [place] level of care (*CSW will initial, date and re-position this form in  chart as items are completed):  Yes   Patient/family provided with [place] Work Department's list of facilities offering this level of care within the geographic area requested by the patient (or if unable, by the patient's family).  Yes   Patient/family informed of their freedom to choose among providers that offer the needed level of care, that participate in Medicare, Medicaid or managed care program needed by the patient, have an available bed and are willing to accept the patient.  Yes   Patient/family informed of [facility]'s ownership interest in [facility], as well as of the fact that they are under no obligation to receive care at these facilities.  PASRR submitted to EDS on 06/16/15     PASRR number received on 06/16/15     Existing PASRR number confirmed on       FL2 transmitted to all facilities in geographic area requested by pt/family on  (Family had conversation with [facility] and worked out an arrangement for admission)     FL2 transmitted to all facilities within larger geographic area on       Patient informed that his/her managed care company has contracts with or will negotiate with certain facilities, including the following:        Yes   Patient/family informed of bed offers received.  Patient chooses bed at  [facility])     Physician recommends and patient chooses bed at  [facility])    Patient to be transferred to  [facility]) on 06/16/15.  Patient to be transferred to facility by  (EMS)     Patient family notified on 06/16/15 of transfer.  Name of family member notified:   (son)     PHYSICIAN     Additional Comment:    _______________________________________________ Shela Leff, LCSW 06/16/2015, 2:48 PM

## 2015-06-16 NOTE — ED Notes (Signed)
This RN left a message for Social Consult.

## 2015-06-16 NOTE — ED Notes (Signed)
Pt unable to urinate on bedpan after trying for about ten min. MD notified by other tech and got the okay to drain pt bladder using in and out cath. Pt was ok with this. This tech alone with Di Kindle EDT drained pts bladder. 643mL of urine drained from bladder.

## 2015-06-16 NOTE — Discharge Instructions (Signed)
You're being treated for right tibia and fibular fracture. No weightbearing to that lower extremity until seen in follow-up.  You do need to make an appointment with orthopedic physician, call for an appointment on Monday.  Return to emergency for any worsening pain, numbness or tingling of the extremity, blue toes or loss of sensation.   Fibular Ankle Fracture Treated With or Without Immobilization, Adult A fibular fracture at your ankle is a break (fracture) bone in the smallest of the two bones in your lower leg, located on the outside of your leg (fibula) close to the area at your ankle joint. CAUSES  Rolling your ankle.  Twisting your ankle.  Extreme flexing or extending of your foot.  Severe force on your ankle as when falling from a distance. RISK FACTORS  Jumping activities.  Participation in sports.  Osteoporosis.  Advanced age.  Previous ankle injuries. SIGNS AND SYMPTOMS  Pain.  Swelling.  Inability to put weight on injured ankle.  Bruising.  Bone deformities at site of injury. DIAGNOSIS  This fracture is diagnosed with the help of an X-ray exam. TREATMENT  If the fractured bone did not move out of place it usually will heal without problems and does casting or splinting. If immobilization is needed for comfort or the fractured bone moved out of place and will not heal properly with immobilization, a cast or splint will be used. HOME CARE INSTRUCTIONS   Apply ice to the area of injury:  Put ice in a plastic bag.  Place a towel between your skin and the bag.  Leave the ice on for 20 minutes, 2-3 times a day.  Use crutches as directed. Resume walking without crutches as directed by your health care provider.  Only take over-the-counter or prescription medicines for pain, discomfort, or fever as directed by your health care provider.  If you have a removable splint or boot, do not remove the boot unless directed by your health care provider. SEEK  MEDICAL CARE IF:   You have continued pain or more swelling  The medications do not control the pain. SEEK IMMEDIATE MEDICAL CARE IF:  You develop severe pain in the leg or foot.  Your skin or nails below the injury turn blue or grey or feel cold or numb. MAKE SURE YOU:   Understand these instructions.  Will watch your condition.  Will get help right away if you are not doing well or get worse.   This information is not intended to replace advice given to you by your health care provider. Make sure you discuss any questions you have with your health care provider.   Document Released: 12/19/2004 Document Revised: 01/09/2014 Document Reviewed: 07/31/2012 Elsevier Interactive Patient Education [employer].

## 2015-06-16 NOTE — ED Notes (Signed)
Pt arrived by EMS from home after an unwitnessed fall during the night. Pt was found in the floor this morning by her son. Pt c/o right ankle pain. Pt denies hip pain.

## 2015-06-16 NOTE — Clinical Social Work Note (Signed)
CSW called by ED regarding son requesting placement for his mother. CSW also received call from [place] at [facility] stating that they have spoken to patient's son: Mr. Churn: [phone] and they are able to accept patient because the son is going to pay privately. [place] provided room and report number. CSW has provided FL2 and pasrr and script for controlled med. CSW has spoken to patient's son and he is in agreement with the above plan and CSW also explained to the son the medicare guidelines for coverage for STR. Patient's son verbalized understanding and expressed gratitude for CSW assistance. Patient's son does request transport via EMS and has been made aware that there is a potential for non coverage. Patient's son verbalized understanding. Shela Leff MSW,LCSW [phone]

## 2015-06-17 DIAGNOSIS — H3530 Unspecified macular degeneration: Secondary | ICD-10-CM

## 2015-06-17 DIAGNOSIS — E785 Hyperlipidemia, unspecified: Secondary | ICD-10-CM | POA: Diagnosis not present

## 2015-06-17 DIAGNOSIS — S82291A Other fracture of shaft of right tibia, initial encounter for closed fracture: Secondary | ICD-10-CM | POA: Diagnosis not present

## 2015-06-17 DIAGNOSIS — I10 Essential (primary) hypertension: Secondary | ICD-10-CM | POA: Diagnosis not present

## 2015-06-17 DIAGNOSIS — E039 Hypothyroidism, unspecified: Secondary | ICD-10-CM | POA: Diagnosis not present

## 2015-06-18 LAB — URINE CULTURE

## 2015-06-19 NOTE — Progress Notes (Signed)
ED Antimicrobial Stewardship Positive Culture Follow Up   Traci Vaughan is an 82 y.o. female who presented to [facility] on 06/16/2015 with a chief complaint of  Chief Complaint  Patient presents with  . Ankle Pain  . Leg Injury    Recent Results (from the past 720 hour(s))  Urine culture     Status: Abnormal   Collection Time: 06/16/15 10:35 AM  Result Value Ref Range Status   Specimen Description URINE, RANDOM  Final   Special Requests NONE  Final   Culture >=100,000 COLONIES/mL ENTEROCOCCUS SPECIES (A)  Final   Report Status 06/18/2015 FINAL  Final   Organism ID, Bacteria ENTEROCOCCUS SPECIES (A)  Final      Susceptibility   Enterococcus species - MIC*    AMPICILLIN <=2 SENSITIVE Sensitive     LEVOFLOXACIN 2 SENSITIVE Sensitive     NITROFURANTOIN <=16 SENSITIVE Sensitive     VANCOMYCIN 1 SENSITIVE Sensitive     * >=100,000 COLONIES/mL ENTEROCOCCUS SPECIES    []  Treated with , organism resistant to prescribed antimicrobial [x]  Patient discharged originally without antimicrobial agent and treatment is now indicated  New antibiotic prescription: amoxicillin 500mg  PO Q12H x 5days  ED Provider: Margaretann Loveless  Rx called into [place], PharmD Clinical Pharmacist  06/19/2015 2:43 PM

## 2015-06-22 DIAGNOSIS — R4781 Slurred speech: Secondary | ICD-10-CM | POA: Diagnosis not present

## 2015-06-26 ENCOUNTER — Other Ambulatory Visit: Payer: Self-pay

## 2015-06-26 ENCOUNTER — Emergency Department: Payer: Medicare Other

## 2015-06-26 ENCOUNTER — Inpatient Hospital Stay
Admission: EM | Admit: 2015-06-26 | Discharge: 2015-06-30 | DRG: 640 | Disposition: A | Discharge status: Skilled Nursing Facility | Payer: Medicare Other | Attending: Internal Medicine | Admitting: Internal Medicine

## 2015-06-26 ENCOUNTER — Encounter: Payer: Self-pay | Admitting: *Deleted

## 2015-06-26 DIAGNOSIS — R42 Dizziness and giddiness: Secondary | ICD-10-CM

## 2015-06-26 DIAGNOSIS — Z79899 Other long term (current) drug therapy: Secondary | ICD-10-CM

## 2015-06-26 DIAGNOSIS — Z87440 Personal history of urinary (tract) infections: Secondary | ICD-10-CM

## 2015-06-26 DIAGNOSIS — R531 Weakness: Secondary | ICD-10-CM | POA: Diagnosis not present

## 2015-06-26 DIAGNOSIS — H3530 Unspecified macular degeneration: Secondary | ICD-10-CM | POA: Diagnosis present

## 2015-06-26 DIAGNOSIS — E079 Disorder of thyroid, unspecified: Secondary | ICD-10-CM | POA: Diagnosis present

## 2015-06-26 DIAGNOSIS — E039 Hypothyroidism, unspecified: Secondary | ICD-10-CM | POA: Diagnosis present

## 2015-06-26 DIAGNOSIS — E860 Dehydration: Principal | ICD-10-CM | POA: Diagnosis present

## 2015-06-26 DIAGNOSIS — Z85828 Personal history of other malignant neoplasm of skin: Secondary | ICD-10-CM

## 2015-06-26 DIAGNOSIS — R6883 Chills (without fever): Secondary | ICD-10-CM | POA: Diagnosis present

## 2015-06-26 DIAGNOSIS — D696 Thrombocytopenia, unspecified: Secondary | ICD-10-CM | POA: Diagnosis present

## 2015-06-26 DIAGNOSIS — Z8543 Personal history of malignant neoplasm of ovary: Secondary | ICD-10-CM

## 2015-06-26 DIAGNOSIS — S82201A Unspecified fracture of shaft of right tibia, initial encounter for closed fracture: Secondary | ICD-10-CM | POA: Diagnosis present

## 2015-06-26 DIAGNOSIS — E876 Hypokalemia: Secondary | ICD-10-CM | POA: Diagnosis present

## 2015-06-26 DIAGNOSIS — Z79890 Hormone replacement therapy (postmenopausal): Secondary | ICD-10-CM

## 2015-06-26 DIAGNOSIS — N390 Urinary tract infection, site not specified: Secondary | ICD-10-CM

## 2015-06-26 DIAGNOSIS — S82401A Unspecified fracture of shaft of right fibula, initial encounter for closed fracture: Secondary | ICD-10-CM | POA: Diagnosis present

## 2015-06-26 DIAGNOSIS — N289 Disorder of kidney and ureter, unspecified: Secondary | ICD-10-CM

## 2015-06-26 DIAGNOSIS — N183 Chronic kidney disease, stage 3 (moderate): Secondary | ICD-10-CM | POA: Diagnosis present

## 2015-06-26 DIAGNOSIS — R300 Dysuria: Secondary | ICD-10-CM | POA: Diagnosis present

## 2015-06-26 DIAGNOSIS — W19XXXA Unspecified fall, initial encounter: Secondary | ICD-10-CM | POA: Diagnosis present

## 2015-06-26 DIAGNOSIS — G9341 Metabolic encephalopathy: Secondary | ICD-10-CM | POA: Diagnosis present

## 2015-06-26 LAB — URINALYSIS COMPLETE WITH MICROSCOPIC (ARMC ONLY)
BILIRUBIN URINE: NEGATIVE
Bacteria, UA: NONE SEEN
GLUCOSE, UA: NEGATIVE mg/dL
HGB URINE DIPSTICK: NEGATIVE
KETONES UR: NEGATIVE mg/dL
Nitrite: NEGATIVE
PH: 6 (ref 5.0–8.0)
Protein, ur: NEGATIVE mg/dL
Specific Gravity, Urine: 1.008 (ref 1.005–1.030)

## 2015-06-26 LAB — CBC WITH DIFFERENTIAL/PLATELET
BASOS ABS: 0.1 10*3/uL (ref 0–0.1)
BASOS PCT: 1 %
EOS ABS: 0.2 10*3/uL (ref 0–0.7)
EOS PCT: 3 %
HEMATOCRIT: 36.2 % (ref 35.0–47.0)
Hemoglobin: 12 g/dL (ref 12.0–16.0)
Lymphocytes Relative: 22 %
Lymphs Abs: 1.7 10*3/uL (ref 1.0–3.6)
MCH: 28.6 pg (ref 26.0–34.0)
MCHC: 33.2 g/dL (ref 32.0–36.0)
MCV: 86.2 fL (ref 80.0–100.0)
MONO ABS: 0.7 10*3/uL (ref 0.2–0.9)
Monocytes Relative: 9 %
NEUTROS ABS: 5.2 10*3/uL (ref 1.4–6.5)
Neutrophils Relative %: 65 %
PLATELETS: 217 10*3/uL (ref 150–440)
RBC: 4.2 MIL/uL (ref 3.80–5.20)
RDW: 15.2 % — AB (ref 11.5–14.5)
WBC: 8 10*3/uL (ref 3.6–11.0)

## 2015-06-26 LAB — COMPREHENSIVE METABOLIC PANEL
ALT: 13 U/L — ABNORMAL LOW (ref 14–54)
AST: 18 U/L (ref 15–41)
Albumin: 3.3 g/dL — ABNORMAL LOW (ref 3.5–5.0)
Alkaline Phosphatase: 73 U/L (ref 38–126)
Anion gap: 10 (ref 5–15)
BUN: 29 mg/dL — ABNORMAL HIGH (ref 6–20)
CO2: 26 mmol/L (ref 22–32)
Calcium: 8.9 mg/dL (ref 8.9–10.3)
Chloride: 101 mmol/L (ref 101–111)
Creatinine, Ser: 1.36 mg/dL — ABNORMAL HIGH (ref 0.44–1.00)
GFR calc Af Amer: 37 mL/min — ABNORMAL LOW (ref >60)
GFR calc non Af Amer: 32 mL/min — ABNORMAL LOW (ref >60)
Glucose, Bld: 91 mg/dL (ref 65–99)
Potassium: 3.5 mmol/L (ref 3.5–5.1)
Sodium: 137 mmol/L (ref 135–145)
Total Bilirubin: 0.4 mg/dL (ref 0.3–1.2)
Total Protein: 6.4 g/dL — ABNORMAL LOW (ref 6.5–8.1)

## 2015-06-26 LAB — PROTIME-INR
INR: 1.1
Prothrombin Time: 14.4 seconds (ref 11.4–15.0)

## 2015-06-26 LAB — APTT: APTT: 32 s (ref 24–36)

## 2015-06-26 LAB — TROPONIN I: Troponin I: <0.03 ng/mL (ref <0.031)

## 2015-06-26 MED ORDER — SODIUM CHLORIDE 0.9 % IV BOLUS (SEPSIS)
1000.0000 mL | Freq: Once | INTRAVENOUS | Status: AC
  Administered 2015-06-26: 1000 mL via INTRAVENOUS

## 2015-06-26 MED ORDER — LEVOFLOXACIN IN D5W 750 MG/150ML IV SOLN
750.0000 mg | Freq: Once | INTRAVENOUS | Status: AC
  Administered 2015-06-26: 750 mg via INTRAVENOUS
  Filled 2015-06-26: qty 150

## 2015-06-26 MED ORDER — MECLIZINE HCL 25 MG PO TABS
12.5000 mg | ORAL_TABLET | Freq: Once | ORAL | Status: AC
  Administered 2015-06-26: 12.5 mg via ORAL
  Filled 2015-06-26: qty 1

## 2015-06-26 NOTE — ED Notes (Signed)
Called [place], [place] son, to inform him of probable admission. Called [place] to inform of probable admission, Audiological scientist.

## 2015-06-26 NOTE — ED Provider Notes (Signed)
[facility] Emergency Department Provider Note   ____________________________________________  Time seen: Approximately 9:16 PM  I have reviewed the triage vital signs and the nursing notes.   HISTORY  Chief Complaint Dizziness    HPI Traci Vaughan is a 82 y.o. female with history of thyroid disease, status post fall and right distal tibia fibular fracture managed nonoperatively with a cast on 06/16/2015 who presents for evaluation of generalized weakness, intermittent dizziness, intermittent confusion and lightheadedness today, gradual onset, moderate and worse with position change. Patient reports that she has had several instances today where she starts shaking uncontrollably, feels weak and lightheaded as if she might faint and fall. She also feels "like I'm on my head" but then denies dizziness. No headache, no chest pain, no vomiting, diarrhea, fevers or chills. She was recently treated for urinary tract infection with amoxicillin however she reports continued increased urinary frequency.    Past Medical History  Diagnosis Date  . Macular degeneration   . Cancer [facility]) 1984    ovarian /Dr Lottie Rater [facility]  . Skin cancer   . Thyroid disease     There are no active problems to display for this patient.   Past Surgical History  Procedure Laterality Date  . Ovary surgery  1984    [facility]  . Vaginal hysterectomy  1984  . Colonoscopy    . Orif Right     Lower leg    Current Outpatient Rx  Name  Route  Sig  Dispense  Refill  . amoxicillin (AMOXIL) 500 MG capsule   Oral   Take 500 mg by mouth 2 (two) times daily.         Marland Kitchen atorvastatin (LIPITOR) 10 MG tablet   Oral   Take 10 mg by mouth daily at 6 PM.          . Biotin 1 MG CAPS   Oral   Take 2.5 tablets by mouth daily.          . brimonidine (ALPHAGAN) 0.15 % ophthalmic solution   Both Eyes   Place 1 drop into both eyes 2 (two) times daily. Reported on 06/26/2015         .  Cholecalciferol (VITAMIN D-3 PO)   Oral   Take 1,000 Units by mouth daily.          . Cyanocobalamin (VITAMIN B 12 PO)   Oral   Take 2,500 mcg by mouth daily.          . felodipine (PLENDIL) 5 MG 24 hr tablet   Oral   Take 5 mg by mouth daily.          Marland Kitchen HYDROcodone-acetaminophen (NORCO/VICODIN) 5-325 MG tablet   Oral   Take 1 tablet by mouth every 4 (four) hours as needed for moderate pain or severe pain.   30 tablet   0   . Multiple Vitamins-Minerals (ZINC PO)   Oral   Take 50 mg by mouth daily.          . naproxen sodium (ANAPROX) 220 MG tablet   Oral   Take 220 mg by mouth as needed.         . polyethylene glycol (MIRALAX / GLYCOLAX) packet   Oral   Take 17 g by mouth daily.         Marland Kitchen PREMARIN 0.625 MG tablet   Oral   Take 0.625 mg by mouth daily.  Dispense as written.   Marland Kitchen SYNTHROID 75 MCG tablet   Oral   Take 75 mcg by mouth every other day. Mondays, Wednesdays, and Fridays.           Dispense as written.   . timolol (TIMOPTIC) 0.5 % ophthalmic solution   Both Eyes   Place 1 drop into both eyes 2 (two) times daily.          Marland Kitchen triamterene-hydrochlorothiazide (MAXZIDE-25) 37.5-25 MG tablet   Oral   Take 1 tablet by mouth daily.          . vitamin E 400 UNIT capsule   Oral   Take 400 Units by mouth daily.         . Multiple Vitamins-Minerals (EYE VITAMINS PO)   Oral   Take by mouth 2 (two) times daily. Reported on 06/26/2015           Allergies Levothyroxine sodium  History reviewed. No pertinent family history.  Social History Social History  Substance Use Topics  . Smoking status: Never Smoker   . Smokeless tobacco: Never Used  . Alcohol Use: No    Review of Systems Constitutional: No fever/chills Eyes: No visual changes. ENT: No sore throat. Cardiovascular: Denies chest pain. Respiratory: Denies shortness of breath. Gastrointestinal: No abdominal pain.  No nausea, no vomiting.  No diarrhea.  No  constipation. Genitourinary: Negative for dysuria. Musculoskeletal: Negative for back pain. Skin: Negative for rash. Neurological: Negative for headaches, focal weakness or numbness.  10-point ROS otherwise negative.  ____________________________________________   PHYSICAL EXAM:  VITAL SIGNS: ED Triage Vitals  Enc Vitals Group     BP 06/26/15 2111 154/62 mmHg     Pulse Rate 06/26/15 2111 62     Resp 06/26/15 2111 20     Temp 06/26/15 2111 98 F (36.7 C)     Temp Source 06/26/15 2111 Oral     SpO2 06/26/15 2111 98 %     Weight 06/26/15 2111 107 lb (48.535 kg)     Height 06/26/15 2111 5\' 4"  (1.626 m)     Head Cir --      Peak Flow --      Pain Score 06/26/15 2109 0     Pain Loc --      Pain Edu? --      Excl. in [place]? --     Constitutional: Alert and oriented. Well appearing and in no acute distress. Eyes: Conjunctivae are normal. PERRL. EOMI. Head: Atraumatic. Nose: No congestion/rhinnorhea. Mouth/Throat: Mucous membranes are moist.  Oropharynx non-erythematous. Neck: No stridor.  Cardiovascular: Normal rate, regular rhythm. Grossly normal heart sounds.  Good peripheral circulation. Respiratory: Normal respiratory effort.  No retractions. Lungs CTAB. Gastrointestinal: Soft and nontender. No distention.  No CVA tenderness. Genitourinary: deferred Musculoskeletal: Long leg cast on the right leg, brisk cap refill in the right toes. Multiple areas of healing ecchymosis throughout the arms bilaterally. Neurologic:  Normal speech and language. No gross focal neurologic deficits are appreciated. Cranial nerves II through XII intact, normal finger-nose-finger. 5 out of 5 strength in all extremities (though RLE exam is slightly limited due to the large cast), intact sensation to light touch. Skin:  Skin is warm, dry and intact. No rash noted. Psychiatric: Mood and affect are normal. Speech and behavior are normal.  ____________________________________________   LABS (all labs  ordered are listed, but only abnormal results are displayed)  Labs Reviewed  CBC WITH DIFFERENTIAL/PLATELET - Abnormal; Notable for the following:    RDW  15.2 (*)    All other components within normal limits  COMPREHENSIVE METABOLIC PANEL - Abnormal; Notable for the following:    BUN 29 (*)    Creatinine, Ser 1.36 (*)    Total Protein 6.4 (*)    Albumin 3.3 (*)    ALT 13 (*)    GFR calc non Af Amer 32 (*)    GFR calc Af Amer 37 (*)    All other components within normal limits  URINALYSIS COMPLETEWITH MICROSCOPIC ([facility] ONLY) - Abnormal; Notable for the following:    Color, Urine STRAW (*)    APPearance CLEAR (*)    Leukocytes, UA 3+ (*)    Squamous Epithelial / LPF 0-5 (*)    All other components within normal limits  CULTURE, BLOOD (ROUTINE X 2)  CULTURE, BLOOD (ROUTINE X 2)  URINE CULTURE  TROPONIN I  PROTIME-INR  APTT   ____________________________________________  EKG  ED ECG REPORT I, Joanne Gavel, the attending physician, personally viewed and interpreted this ECG.   Date: 06/26/2015  EKG Time: 21:11  Rate: 108  Rhythm: Supraventricular bigeminy  Axis: normal  Intervals:none  ST&T Change: No acute ST elevation.  ED ECG REPORT I, Joanne Gavel, the attending physician, personally viewed and interpreted this ECG.   Date: 06/26/2015  EKG Time: 22:11  Rate: 93  Rhythm: Supraventricular bigeminy.  Axis: normal  Intervals:none  ST&T Change: No acute ST elevation.   ____________________________________________  RADIOLOGY  CXR IMPRESSION: No active cardiopulmonary disease.  CT head IMPRESSION: No acute intracranial findings.  Chronic atrophy and white matter microvascular disease. ____________________________________________   PROCEDURES  Procedure(s) performed: None  Critical Care performed: No  ____________________________________________   INITIAL IMPRESSION / ASSESSMENT AND PLAN / ED COURSE  Pertinent labs & imaging results that  were available during my care of the patient were reviewed by me and considered in my medical decision making (see chart for details).  ADALEYA MCDANIELS is a 82 y.o. female with history of thyroid disease, status post fall and right distal tibia fibular fracture managed nonoperatively with a cast on 06/16/2015 who presents for evaluation of generalized weakness, intermittent dizziness, intermittent confusion and lightheadedness today. On exam, she is nontoxic appearing and in no acute distress. Her vital signs are stable, she is afebrile, she is an intact neurological examination, no dysmetria, doubt posterior circulation stroke however given her age and her various symptoms, we'll obtain labs, chest x-ray, urinalysis, CT head and reassess for disposition.  ----------------------------------------- 11:37 PM on 06/26/2015 ----------------------------------------- CT head shows no acute intracranial process per a chest x-ray with no active cardiopulmonary disease. Patient is resting comfortably in no distress. CBC unremarkable. CMP with mild creatinine elevation which appears chronic, negative troponin. Patient had an episode of severe disequilibrium when she was being rolled in the bed, I treated her with meclizine and currently she appears well. Her urinalysis is consistent with a continued urinary tract infection which at this point has likely failed outpatient management. She had urine cultures from 06/16/2015 which were sensitive to ampicillin and Levaquin, she was treated with a 5 day course of amoxicillin and she continues to have increased urinary frequency and an infected-appearing urinalysis. I have obtained blood cultures, we'll give IV Levaquin and discuss with the hospitalist for admission.  ____________________________________________   FINAL CLINICAL IMPRESSION(S) / ED DIAGNOSES  Final diagnoses:  UTI (lower urinary tract infection)  Weakness  Lightheaded      NEW MEDICATIONS STARTED  DURING THIS VISIT:  New  Prescriptions   No medications on file     Note:  This document was prepared using Dragon voice recognition software and may include unintentional dictation errors.    Joanne Gavel, MD 06/27/15 [phone]

## 2015-06-26 NOTE — ED Notes (Signed)
Pt states she feels shaky and as if she is that she is falling while she is laying flat. Pt recently broke her R leg. Pt is living in rehab facility presently since her leg was broken. Pt was hypertensive for EMS. Pt is A & O x 3.

## 2015-06-26 NOTE — ED Notes (Signed)
When pt is laying flat, she feels as if she is sliding off the bed. Pt becomes increasingly agitated and anxious in this position, stiffening her muscles and resisting directives to roll from side to side because her equilibrium is so disturbed.

## 2015-06-27 ENCOUNTER — Encounter: Payer: Self-pay | Admitting: Internal Medicine

## 2015-06-27 ENCOUNTER — Observation Stay: Payer: Medicare Other

## 2015-06-27 DIAGNOSIS — S82401A Unspecified fracture of shaft of right fibula, initial encounter for closed fracture: Secondary | ICD-10-CM | POA: Diagnosis present

## 2015-06-27 DIAGNOSIS — E876 Hypokalemia: Secondary | ICD-10-CM | POA: Diagnosis present

## 2015-06-27 DIAGNOSIS — Z85828 Personal history of other malignant neoplasm of skin: Secondary | ICD-10-CM | POA: Diagnosis not present

## 2015-06-27 DIAGNOSIS — R42 Dizziness and giddiness: Secondary | ICD-10-CM

## 2015-06-27 DIAGNOSIS — H3530 Unspecified macular degeneration: Secondary | ICD-10-CM | POA: Diagnosis present

## 2015-06-27 DIAGNOSIS — Z79899 Other long term (current) drug therapy: Secondary | ICD-10-CM | POA: Diagnosis not present

## 2015-06-27 DIAGNOSIS — N183 Chronic kidney disease, stage 3 (moderate): Secondary | ICD-10-CM | POA: Diagnosis present

## 2015-06-27 DIAGNOSIS — W19XXXA Unspecified fall, initial encounter: Secondary | ICD-10-CM | POA: Diagnosis present

## 2015-06-27 DIAGNOSIS — D696 Thrombocytopenia, unspecified: Secondary | ICD-10-CM | POA: Diagnosis present

## 2015-06-27 DIAGNOSIS — Z79890 Hormone replacement therapy (postmenopausal): Secondary | ICD-10-CM | POA: Diagnosis not present

## 2015-06-27 DIAGNOSIS — E079 Disorder of thyroid, unspecified: Secondary | ICD-10-CM | POA: Diagnosis present

## 2015-06-27 DIAGNOSIS — E860 Dehydration: Secondary | ICD-10-CM | POA: Diagnosis present

## 2015-06-27 DIAGNOSIS — E039 Hypothyroidism, unspecified: Secondary | ICD-10-CM | POA: Diagnosis present

## 2015-06-27 DIAGNOSIS — N390 Urinary tract infection, site not specified: Secondary | ICD-10-CM | POA: Diagnosis present

## 2015-06-27 DIAGNOSIS — R6883 Chills (without fever): Secondary | ICD-10-CM | POA: Diagnosis present

## 2015-06-27 DIAGNOSIS — Z87440 Personal history of urinary (tract) infections: Secondary | ICD-10-CM | POA: Diagnosis not present

## 2015-06-27 DIAGNOSIS — S82201A Unspecified fracture of shaft of right tibia, initial encounter for closed fracture: Secondary | ICD-10-CM | POA: Diagnosis present

## 2015-06-27 DIAGNOSIS — G9341 Metabolic encephalopathy: Secondary | ICD-10-CM | POA: Diagnosis present

## 2015-06-27 DIAGNOSIS — Z8543 Personal history of malignant neoplasm of ovary: Secondary | ICD-10-CM | POA: Diagnosis not present

## 2015-06-27 DIAGNOSIS — R300 Dysuria: Secondary | ICD-10-CM | POA: Diagnosis present

## 2015-06-27 DIAGNOSIS — R531 Weakness: Secondary | ICD-10-CM | POA: Diagnosis present

## 2015-06-27 LAB — CBC
HEMATOCRIT: 33.8 % — AB (ref 35.0–47.0)
HEMOGLOBIN: 11.4 g/dL — AB (ref 12.0–16.0)
MCH: 28.9 pg (ref 26.0–34.0)
MCHC: 33.6 g/dL (ref 32.0–36.0)
MCV: 86 fL (ref 80.0–100.0)
Platelets: 212 10*3/uL (ref 150–440)
RBC: 3.94 MIL/uL (ref 3.80–5.20)
RDW: 15.2 % — ABNORMAL HIGH (ref 11.5–14.5)
WBC: 8.4 10*3/uL (ref 3.6–11.0)

## 2015-06-27 LAB — BASIC METABOLIC PANEL
ANION GAP: 9 (ref 5–15)
BUN: 24 mg/dL — ABNORMAL HIGH (ref 6–20)
CALCIUM: 8.5 mg/dL — AB (ref 8.9–10.3)
CO2: 24 mmol/L (ref 22–32)
Chloride: 106 mmol/L (ref 101–111)
Creatinine, Ser: 1.2 mg/dL — ABNORMAL HIGH (ref 0.44–1.00)
GFR, EST AFRICAN AMERICAN: 43 mL/min — AB (ref >60)
GFR, EST NON AFRICAN AMERICAN: 37 mL/min — AB (ref >60)
Glucose, Bld: 102 mg/dL — ABNORMAL HIGH (ref 65–99)
POTASSIUM: 3.5 mmol/L (ref 3.5–5.1)
Sodium: 139 mmol/L (ref 135–145)

## 2015-06-27 MED ORDER — VITAMIN D 1000 UNITS PO TABS
1000.0000 [IU] | ORAL_TABLET | Freq: Every day | ORAL | Status: DC
Start: 2015-06-27 — End: 2015-06-30
  Administered 2015-06-27 – 2015-06-30 (×4): 1000 [IU] via ORAL
  Filled 2015-06-27 (×5): qty 1

## 2015-06-27 MED ORDER — PROSIGHT PO TABS
1.0000 | ORAL_TABLET | Freq: Two times a day (BID) | ORAL | Status: DC
  Administered 2015-06-27 – 2015-06-30 (×8): 1 via ORAL
  Filled 2015-06-27 (×9): qty 1

## 2015-06-27 MED ORDER — BRIMONIDINE TARTRATE 0.15 % OP SOLN
1.0000 [drp] | Freq: Two times a day (BID) | OPHTHALMIC | Status: DC
  Administered 2015-06-27 – 2015-06-30 (×8): 1 [drp] via OPHTHALMIC
  Filled 2015-06-27: qty 5

## 2015-06-27 MED ORDER — ACETAMINOPHEN 325 MG PO TABS: 650.0000 mg | ORAL_TABLET | Freq: Four times a day (QID) | ORAL | Status: DC | PRN

## 2015-06-27 MED ORDER — SENNOSIDES-DOCUSATE SODIUM 8.6-50 MG PO TABS: 1.0000 | ORAL_TABLET | Freq: Every evening | ORAL | Status: DC | PRN

## 2015-06-27 MED ORDER — VITAMIN E 180 MG (400 UNIT) PO CAPS
400.0000 [IU] | ORAL_CAPSULE | Freq: Every day | ORAL | Status: DC
  Administered 2015-06-27 – 2015-06-30 (×4): 400 [IU] via ORAL
  Filled 2015-06-27 (×5): qty 1

## 2015-06-27 MED ORDER — SODIUM CHLORIDE 0.9% FLUSH: 3.0000 mL | INTRAVENOUS | Status: DC | PRN

## 2015-06-27 MED ORDER — BIOTIN 1 MG PO CAPS: 2.5000 | ORAL_CAPSULE | Freq: Every day | ORAL | Status: DC

## 2015-06-27 MED ORDER — ATORVASTATIN CALCIUM 10 MG PO TABS
10.0000 mg | ORAL_TABLET | Freq: Every day | ORAL | Status: DC
  Administered 2015-06-27 – 2015-06-29 (×3): 10 mg via ORAL
  Filled 2015-06-27 (×3): qty 1

## 2015-06-27 MED ORDER — ENOXAPARIN SODIUM 40 MG/0.4ML ~~LOC~~ SOLN: 40.0000 mg | SUBCUTANEOUS | Status: DC

## 2015-06-27 MED ORDER — SODIUM CHLORIDE 0.9% FLUSH
3.0000 mL | Freq: Two times a day (BID) | INTRAVENOUS | Status: DC
  Administered 2015-06-27 – 2015-06-30 (×8): 3 mL via INTRAVENOUS

## 2015-06-27 MED ORDER — ACETAMINOPHEN 650 MG RE SUPP: 650.0000 mg | Freq: Four times a day (QID) | RECTAL | Status: DC | PRN

## 2015-06-27 MED ORDER — ONDANSETRON HCL 4 MG/2ML IJ SOLN
4.0000 mg | Freq: Four times a day (QID) | INTRAMUSCULAR | Status: DC | PRN
Start: 2015-06-27 — End: 2015-06-30

## 2015-06-27 MED ORDER — TIMOLOL MALEATE 0.5 % OP SOLN
1.0000 [drp] | Freq: Two times a day (BID) | OPHTHALMIC | Status: DC
  Administered 2015-06-27 – 2015-06-30 (×8): 1 [drp] via OPHTHALMIC
  Filled 2015-06-27: qty 5

## 2015-06-27 MED ORDER — FELODIPINE ER 5 MG PO TB24
5.0000 mg | ORAL_TABLET | Freq: Every day | ORAL | Status: DC
  Administered 2015-06-27 – 2015-06-30 (×4): 5 mg via ORAL
  Filled 2015-06-27 (×5): qty 1

## 2015-06-27 MED ORDER — SODIUM CHLORIDE 0.9 % IV SOLN: 250.0000 mL | INTRAVENOUS | Status: DC | PRN

## 2015-06-27 MED ORDER — HYDROCODONE-ACETAMINOPHEN 5-325 MG PO TABS: 1.0000 | ORAL_TABLET | ORAL | Status: DC | PRN

## 2015-06-27 MED ORDER — POTASSIUM CHLORIDE CRYS ER 20 MEQ PO TBCR
20.0000 meq | EXTENDED_RELEASE_TABLET | Freq: Once | ORAL | Status: AC
  Administered 2015-06-27: 20 meq via ORAL
  Filled 2015-06-27: qty 1

## 2015-06-27 MED ORDER — LEVOFLOXACIN IN D5W 250 MG/50ML IV SOLN
250.0000 mg | INTRAVENOUS | Status: DC
  Administered 2015-06-27: 250 mg via INTRAVENOUS
  Filled 2015-06-27 (×2): qty 50

## 2015-06-27 MED ORDER — HEPARIN SODIUM (PORCINE) 5000 UNIT/ML IJ SOLN
5000.0000 [IU] | Freq: Two times a day (BID) | INTRAMUSCULAR | Status: DC
  Administered 2015-06-27 – 2015-06-30 (×8): 5000 [IU] via SUBCUTANEOUS
  Filled 2015-06-27 (×9): qty 1

## 2015-06-27 MED ORDER — POLYETHYLENE GLYCOL 3350 17 G PO PACK
17.0000 g | PACK | Freq: Every day | ORAL | Status: DC
  Administered 2015-06-27 – 2015-06-30 (×3): 17 g via ORAL
  Filled 2015-06-27 (×3): qty 1

## 2015-06-27 MED ORDER — TRIAMTERENE-HCTZ 37.5-25 MG PO TABS
1.0000 | ORAL_TABLET | Freq: Every day | ORAL | Status: DC
  Administered 2015-06-27 – 2015-06-30 (×4): 1 via ORAL
  Filled 2015-06-27 (×5): qty 1

## 2015-06-27 MED ORDER — ONDANSETRON HCL 4 MG PO TABS: 4.0000 mg | ORAL_TABLET | Freq: Four times a day (QID) | ORAL | Status: DC | PRN

## 2015-06-27 NOTE — Care Management Obs Status (Signed)
[place] NOTIFICATION   Patient Details  Name: QUANTA RISDON MRN: [id number] Date of Birth: 01-May-1918   Medicare Observation Status Notification Given:  Yes    Ival Bible, RN 06/27/2015, 10:47 AM

## 2015-06-27 NOTE — NC FL2 (Signed)
[place] LEVEL OF CARE SCREENING TOOL     IDENTIFICATION  Patient Name: Traci Vaughan Birthdate: 06/16/1918 Sex: female Admission Date (Current Location): 06/26/2015  [place] and [place] Number:  Engineering geologist and Address:  [facility], [address], [place], [place] [address]      Provider Number: [id number]  Attending Physician Name and Address:  Theodoro Grist, MD  Relative Name and Phone Number:       Current Level of Care: Hospital Recommended Level of Care: [place] Prior Approval Number:    Date Approved/Denied:   PASRR Number: [id number]  Discharge Plan: SNF    Current Diagnoses: Patient Active Problem List   Diagnosis Date Noted  . UTI (lower urinary tract infection) 06/27/2015  . Dizziness 06/27/2015    Orientation RESPIRATION BLADDER Height & Weight     Self  Normal Incontinent Weight: 107 lb (48.535 kg) Height:  5\' 4"  (162.6 cm)  BEHAVIORAL SYMPTOMS/MOOD NEUROLOGICAL BOWEL NUTRITION STATUS      Incontinent Diet (Heart Healthy)  AMBULATORY STATUS COMMUNICATION OF NEEDS Skin   Limited Assist Verbally Normal                       Personal Care Assistance Level of Assistance  Bathing, Feeding, Dressing Bathing Assistance: Limited assistance Feeding assistance: Limited assistance Dressing Assistance: Limited assistance     Functional Limitations Info  Sight, Hearing, Speech Sight Info: Adequate Hearing Info: Adequate Speech Info: Adequate    SPECIAL CARE FACTORS FREQUENCY  PT (By licensed PT)                    Contractures      Additional Factors Info  Code Status, Allergies Code Status Info: DNR Allergies Info: Levothyroxine Sodium           Current Medications (06/27/2015):  This is the current hospital active medication list Current Facility-Administered Medications  Medication Dose Route Frequency Provider Last Rate Last Dose  . 0.9 %  sodium chloride  infusion  250 mL Intravenous PRN Saundra Shelling, MD      . acetaminophen (TYLENOL) tablet 650 mg  650 mg Oral Q6H PRN Saundra Shelling, MD       Or  . acetaminophen (TYLENOL) suppository 650 mg  650 mg Rectal Q6H PRN Pavan Pyreddy, MD      . atorvastatin (LIPITOR) tablet 10 mg  10 mg Oral q1800 Pavan Pyreddy, MD      . brimonidine (ALPHAGAN) 0.15 % ophthalmic solution 1 drop  1 drop Both Eyes BID Saundra Shelling, MD   1 drop at 06/27/15 0927  . cholecalciferol (VITAMIN D) tablet 1,000 Units  1,000 Units Oral Daily Saundra Shelling, MD   1,000 Units at 06/27/15 0926  . felodipine (PLENDIL) 24 hr tablet 5 mg  5 mg Oral Daily Saundra Shelling, MD   5 mg at 06/27/15 0926  . heparin injection 5,000 Units  5,000 Units Subcutaneous Q12H Saundra Shelling, MD   5,000 Units at 06/27/15 0932  . HYDROcodone-acetaminophen (NORCO/VICODIN) 5-325 MG per tablet 1 tablet  1 tablet Oral Q4H PRN Saundra Shelling, MD      . Levofloxacin (LEVAQUIN) IVPB 250 mg  250 mg Intravenous Q24H Pavan Pyreddy, MD      . multivitamin (PROSIGHT) tablet 1 tablet  1 tablet Oral BID Saundra Shelling, MD   1 tablet at 06/27/15 0927  . ondansetron (ZOFRAN) tablet 4 mg  4 mg Oral Q6H PRN  Saundra Shelling, MD       Or  . ondansetron (ZOFRAN) injection 4 mg  4 mg Intravenous Q6H PRN Pavan Pyreddy, MD      . polyethylene glycol (MIRALAX / GLYCOLAX) packet 17 g  17 g Oral Daily Saundra Shelling, MD   17 g at 06/27/15 0926  . senna-docusate (Senokot-S) tablet 1 tablet  1 tablet Oral QHS PRN Pavan Pyreddy, MD      . sodium chloride flush (NS) 0.9 % injection 3 mL  3 mL Intravenous Q12H Saundra Shelling, MD   3 mL at 06/27/15 0928  . sodium chloride flush (NS) 0.9 % injection 3 mL  3 mL Intravenous PRN Pavan Pyreddy, MD      . timolol (TIMOPTIC) 0.5 % ophthalmic solution 1 drop  1 drop Both Eyes BID Saundra Shelling, MD   1 drop at 06/27/15 0927  . triamterene-hydrochlorothiazide (MAXZIDE-25) 37.5-25 MG per tablet 1 tablet  1 tablet Oral Daily Saundra Shelling, MD   1 tablet at  06/27/15 0927  . vitamin E capsule 400 Units  400 Units Oral Daily Saundra Shelling, MD   400 Units at 06/27/15 [id number]     Discharge Medications: Please see discharge summary for a list of discharge medications.  Relevant Imaging Results:  Relevant Lab Results:   Additional Information SSN:  [id number]  Darden Dates, LCSW

## 2015-06-27 NOTE — H&P (Signed)
[facility] [place] NAME: Traci Vaughan    MR#:  [id number]  DATE OF BIRTH:  1918-09-06  DATE OF ADMISSION:  06/26/2015  PRIMARY CARE PHYSICIAN: Albina Billet, MD   REQUESTING/REFERRING PHYSICIAN:   CHIEF COMPLAINT:   Chief Complaint  Patient presents with  . Dizziness    HISTORY OF PRESENT ILLNESS: Traci Vaughan  is a 82 y.o. female with a known history of Ovarian cancer, macular degeneration, thyroid disease presented to the emergency room with generalized weakness and dizziness. Patient recently completed a course of oral antibiotic with amoxicillin for urinary tract infection with enterococcus. The urinary tract infection was diagnosed on 06/16/2015 from urine culture and she was put on oral antibiotic. Patient complains of chills and shaking. No history of any fever. Has dysuria. Patient had a fall 2 weeks ago and sustained a right tibial fracture. She has been managed conservatively and currently on a cast. Today patient was given IV Levaquin antibiotic in the emergency room and hospitalist service was consulted for further care of the patient. No history of chest pain. No complaints of shortness of breath. No history of blurry vision.  PAST MEDICAL HISTORY:   Past Medical History  Diagnosis Date  . Macular degeneration   . Cancer [facility]) 1984    ovarian /Dr Lottie Rater [facility]  . Skin cancer   . Thyroid disease     PAST SURGICAL HISTORY: Past Surgical History  Procedure Laterality Date  . Ovary surgery  1984    [facility]  . Vaginal hysterectomy  1984  . Colonoscopy    . Orif Right     Lower leg    SOCIAL HISTORY:  Social History  Substance Use Topics  . Smoking status: Never Smoker   . Smokeless tobacco: Never Used  . Alcohol Use: No    FAMILY HISTORY:  Family History  Problem Relation Age of Onset  . Rheum arthritis Neg Hx   . Osteoarthritis Neg Hx   . Asthma Neg Hx   . Cancer Neg Hx   . Diabetes Neg Hx   . Heart  failure Neg Hx     DRUG ALLERGIES:  Allergies  Allergen Reactions  . Levothyroxine Sodium Other (See Comments)    REVIEW OF SYSTEMS:   CONSTITUTIONAL: No fever, has weakness.  EYES: No blurred or double vision.  EARS, NOSE, AND THROAT: No tinnitus or ear pain.  RESPIRATORY: No cough, shortness of breath, wheezing or hemoptysis.  CARDIOVASCULAR: No chest pain, orthopnea, edema.  GASTROINTESTINAL: No nausea, vomiting, diarrhea or abdominal pain.  GENITOURINARY: No dysuria, hematuria.  ENDOCRINE: No polyuria, nocturia,  HEMATOLOGY: No anemia, easy bruising or bleeding SKIN: No rash or lesion. MUSCULOSKELETAL: No joint pain or arthritis.   NEUROLOGIC: No tingling, numbness, weakness.  PSYCHIATRY: No anxiety or depression.   MEDICATIONS AT HOME:  Prior to Admission medications   Medication Sig Start Date End Date Taking? Authorizing Provider  amoxicillin (AMOXIL) 500 MG capsule Take 500 mg by mouth 2 (two) times daily.   Yes Historical Provider, MD  atorvastatin (LIPITOR) 10 MG tablet Take 10 mg by mouth daily at 6 PM.  12/13/14  Yes Historical Provider, MD  Biotin 1 MG CAPS Take 2.5 tablets by mouth daily.    Yes Historical Provider, MD  brimonidine (ALPHAGAN) 0.15 % ophthalmic solution Place 1 drop into both eyes 2 (two) times daily. Reported on 06/26/2015 11/04/14  Yes Historical Provider, MD  Cholecalciferol (VITAMIN D-3 PO) Take 1,000  Units by mouth daily.    Yes Historical Provider, MD  Cyanocobalamin (VITAMIN B 12 PO) Take 2,500 mcg by mouth daily.    Yes Historical Provider, MD  felodipine (PLENDIL) 5 MG 24 hr tablet Take 5 mg by mouth daily.  01/11/15  Yes Historical Provider, MD  HYDROcodone-acetaminophen (NORCO/VICODIN) 5-325 MG tablet Take 1 tablet by mouth every 4 (four) hours as needed for moderate pain or severe pain. 06/16/15  Yes Lisa Roca, MD  Multiple Vitamins-Minerals (ZINC PO) Take 50 mg by mouth daily.    Yes Historical Provider, MD  naproxen sodium (ANAPROX) 220  MG tablet Take 220 mg by mouth as needed.   Yes Historical Provider, MD  polyethylene glycol (MIRALAX / GLYCOLAX) packet Take 17 g by mouth daily.   Yes Historical Provider, MD  PREMARIN 0.625 MG tablet Take 0.625 mg by mouth daily.  01/01/15  Yes Historical Provider, MD  SYNTHROID 75 MCG tablet Take 75 mcg by mouth every other day. Mondays, Wednesdays, and Fridays. 11/04/14  Yes Historical Provider, MD  timolol (TIMOPTIC) 0.5 % ophthalmic solution Place 1 drop into both eyes 2 (two) times daily.  12/01/14  Yes Historical Provider, MD  triamterene-hydrochlorothiazide (MAXZIDE-25) 37.5-25 MG tablet Take 1 tablet by mouth daily.  12/28/14  Yes Historical Provider, MD  vitamin E 400 UNIT capsule Take 400 Units by mouth daily.   Yes Historical Provider, MD  Multiple Vitamins-Minerals (EYE VITAMINS PO) Take by mouth 2 (two) times daily. Reported on 06/26/2015    Historical Provider, MD      PHYSICAL EXAMINATION:   VITAL SIGNS: Blood pressure 157/68, pulse 74, temperature 98 F (36.7 C), temperature source Oral, resp. rate 23, height 5\' 4"  (1.626 m), weight 48.535 kg (107 lb), SpO2 97 %.  GENERAL:  82 y.o.-year-old patient lying in the bed with no acute distress.  EYES: Pupils equal, round, reactive to light and accommodation. No scleral icterus. Extraocular muscles intact.  HEENT: Head atraumatic, normocephalic. Oropharynx and nasopharynx clear.  NECK:  Supple, no jugular venous distention. No thyroid enlargement, no tenderness.  LUNGS: Normal breath sounds bilaterally, no wheezing, rales,rhonchi or crepitation. No use of accessory muscles of respiration.  CARDIOVASCULAR: S1, S2 normal. No murmurs, rubs, or gallops.  ABDOMEN: Soft, nontender, nondistended. Bowel sounds present. No organomegaly or mass.  EXTREMITIES: No pedal edema, cyanosis, or clubbing. Ecchymosis over extremities. Cast to right lower extremity. NEUROLOGIC: Cranial nerves II through XII are intact. Muscle strength 5/5 in all  extremities. Sensation intact. Gait not checked.  PSYCHIATRIC: The patient is alert and oriented x 3.  SKIN: No obvious rash, lesion, or ulcer. Ecchymosis over the extremities.  LABORATORY PANEL:   CBC  Recent Labs Lab 06/26/15 2120  WBC 8.0  HGB 12.0  HCT 36.2  PLT 217  MCV 86.2  MCH 28.6  MCHC 33.2  RDW 15.2*  LYMPHSABS 1.7  MONOABS 0.7  EOSABS 0.2  BASOSABS 0.1   ------------------------------------------------------------------------------------------------------------------  Chemistries   Recent Labs Lab 06/26/15 2120  NA 137  K 3.5  CL 101  CO2 26  GLUCOSE 91  BUN 29*  CREATININE 1.36*  CALCIUM 8.9  AST 18  ALT 13*  ALKPHOS 73  BILITOT 0.4   ------------------------------------------------------------------------------------------------------------------ estimated creatinine clearance is 18.9 mL/min (by C-G formula based on Cr of 1.36). ------------------------------------------------------------------------------------------------------------------ No results for input(s): TSH, T4TOTAL, T3FREE, THYROIDAB in the last 72 hours.  Invalid input(s): FREET3   Coagulation profile  Recent Labs Lab 06/26/15 2120  INR 1.10   ------------------------------------------------------------------------------------------------------------------- No  results for input(s): DDIMER in the last 72 hours. -------------------------------------------------------------------------------------------------------------------  Cardiac Enzymes  Recent Labs Lab 06/26/15 2120  TROPONINI <0.03   ------------------------------------------------------------------------------------------------------------------ Invalid input(s): POCBNP  ---------------------------------------------------------------------------------------------------------------  Urinalysis    Component Value Date/Time   COLORURINE STRAW* 06/26/2015 2130   APPEARANCEUR CLEAR* 06/26/2015 2130    LABSPEC 1.008 06/26/2015 2130   PHURINE 6.0 06/26/2015 2130   GLUCOSEU NEGATIVE 06/26/2015 2130   HGBUR NEGATIVE 06/26/2015 2130   BILIRUBINUR NEGATIVE 06/26/2015 2130   [place] NEGATIVE 06/26/2015 2130   PROTEINUR NEGATIVE 06/26/2015 2130   NITRITE NEGATIVE 06/26/2015 2130   LEUKOCYTESUR 3+* 06/26/2015 2130     RADIOLOGY: Dg Chest 2 View  06/26/2015  CLINICAL DATA:  82 year old female with high blood pressure. No chest symptoms. EXAM: CHEST  2 VIEW COMPARISON:  None. FINDINGS: Two views of the chest demonstrate emphysematous changes of the lungs. There is mild blunting of the costophrenic angles which may be related to hyperinflation or scarring versus trace pleural effusion. There is no focal consolidation or pneumothorax. The cardiac silhouette is within normal limits. There is osteopenia with degenerative changes of the spine. No acute fracture. Surgical clips noted in the upper abdomen. IMPRESSION: No active cardiopulmonary disease. Electronically Signed   By: Anner Crete M.D.   On: 06/26/2015 22:08   Ct Head Wo Contrast  06/26/2015  CLINICAL DATA:  Pt states she feels shaky and as if she is that she is falling while she is laying flat. Pt recently broke her R leg. Pt is living in rehab facility presently since her leg was broken. No hx of stroke or seizure. Hx of cervical cancer. EXAM: CT HEAD WITHOUT CONTRAST TECHNIQUE: Contiguous axial images were obtained from the base of the skull through the vertex without intravenous contrast. COMPARISON:  None. FINDINGS: No acute intracranial hemorrhage. No focal mass lesion. No CT evidence of acute infarction. No midline shift or mass effect. No hydrocephalus. Basilar cisterns are patent. There are periventricular and subcortical white matter hypodensities. Generalized cortical atrophy. Paranasal sinuses and mastoid air cells are clear. Orbits are clear. IMPRESSION: No acute intracranial findings. Chronic atrophy and white matter microvascular  disease. Electronically Signed   By: Suzy Bouchard M.D.   On: 06/26/2015 21:51    EKG: Orders placed or performed during the hospital encounter of 06/26/15  . ED EKG  . ED EKG    IMPRESSION AND PLAN: 82 year old elderly female patient with history of hypothyroidism, macular degeneration, ovarian cancer presented to the emergency room with dizziness and weakness and shaking chills. She was recently treated for enterococcus urinary tract infection and has a right tibial fracture which was managed conservatively with a cast. Admitting diagnosis 1. Dizziness and weakness 2. Urinary tract infection 3. Right tibial fracture 4. Mild hypokalemia 5. Hypothyroidism Treatment plan Admit patient to medical floor observation bed Start patient will IV Levaquin antibiotic Follow-up urine culture Replace potassium Supportive care DVT prophylaxis with subcutaneous Lovenox 40 MG daily.  All the records are reviewed and case discussed with ED provider. Management plans discussed with the patient, family and they are in agreement.  CODE STATUS:DNR Code Status History    This patient does not have a recorded code status. Please follow your organizational policy for patients in this situation.       TOTAL TIME TAKING CARE OF THIS PATIENT: 50 minutes.    Saundra Shelling M.D on 06/27/2015 at 12:33 AM  Between 7am to 6pm - Pager - [phone]  After 6pm go to [email or web address] - password  EPAS [facility]  [facility]  Office  [phone]  CC: Primary care physician; Albina Billet, MD

## 2015-06-27 NOTE — Clinical Social Work Note (Signed)
Clinical Social Work Assessment  Patient Details  Name: Traci Vaughan MRN: [id number] Date of Birth: June 05, 1918  Date of referral:  06/27/15               Reason for consult:  Facility Placement                Permission sought to share information with:  Family Supports Permission granted to share information::  Yes, Verbal Permission Granted  Name::     Marvena Tally  Relationship::  Son  Contact Information:  [phone]  Housing/Transportation Living arrangements for the past 2 months:  [place] of Information:  Patient, Adult Children Patient Interpreter Needed:  None Criminal Activity/Legal Involvement Pertinent to Current Situation/Hospitalization:  No - Comment as needed Significant Relationships:  Adult Children Lives with:  Facility Resident Do you feel safe going back to the place where you live?  Yes Need for family participation in patient care:  Yes (Comment)  Care giving concerns:  No care giving concerns identified.   Social Worker assessment / plan:  CSW met with pt to address consult. CSW introduced herself and explained role of social work. Pt is pleasantly confused. CSW shared that she will be calling her son, which pt was able to recite his telephone number. CSW called pt's son to address consult. CSW introduced herself and explained role of social work. Pt was [facility] from [facility], where she was private pay as she was admitted from the ED. CSW explained for Medicare to pay for STR, she must have a 3 night inpatient qualifying stay. Currently, pt is considered Observation, therefore will not have a 3 night inpatient qualifying stay. CSW further explained that if pt were to change to inpatient, pt will need 3 nights starting after the order was written. Pt's son is agreeable to pt returning to facility. CSW sent referral to [facility] in anticipation for a return tomorrow. CSW will continue to follow.   Employment status:  Retired Radiation protection practitioner:  Medicare PT Recommendations:  Not assessed at this time [place] / Referral to community resources:  Other (Comment Required) [facility])  Patient/Family's Response to care:  Pt's son was appreciative of CSW support.   Patient/Family's Understanding of and Emotional Response to Diagnosis, Current Treatment, and Prognosis:  Pt's son expressed disappointment in pt's observation status, however feels that pt will need continued STR.  Emotional Assessment Appearance:  Appears stated age Attitude/Demeanor/Rapport:  Other (Appropriate) Affect (typically observed):  Pleasant Orientation:  Oriented to Self, Fluctuating Orientation (Suspected and/or reported Sund[place]) Alcohol / Substance use:  Never Used Psych involvement (Current and /or in the community):  No (Comment)  Discharge Needs  Concerns to be addressed:  Adjustment to Illness Readmission within the last 30 days:  No Current discharge risk:  None Barriers to Discharge:  Continued Medical Work up   [employer], LCSW 06/27/2015, 2:46 PM

## 2015-06-27 NOTE — Progress Notes (Signed)
[facility] NAME: Traci Vaughan    MR#:  [id number]  DATE OF BIRTH:  1918-03-18  SUBJECTIVE:  CHIEF COMPLAINT:   Chief Complaint  Patient presents with  . Dizziness  Patient is a 82 year old female with past medical history significant for history of recent fall about 2 weeks ago with right tibial fracture, currently in cast who presents to the hospital with complaints of lightheadedness, dizziness, unsteady gait, generalized weakness. On arrival to the hospital patient was noted to be confused. Her labs revealed renal insufficiency, acute, thrombocytopenia, pyuria, concerning for UTI. Patient was admitted to the hospital for further evaluation and treatment. She feels well today, remains confused.   Review of Systems  Unable to perform ROS: mental status change    VITAL SIGNS: Blood pressure 142/74, pulse 102, temperature 97.9 F (36.6 C), temperature source Oral, resp. rate 16, height 5\' 4"  (1.626 m), weight 48.535 kg (107 lb), SpO2 96 %.  PHYSICAL EXAMINATION:   GENERAL:  82 y.o.-year-old patient lying in the bed with no acute distress. Dry oral mucosa EYES: Pupils equal, round, reactive to light and accommodation. No scleral icterus. Extraocular muscles intact.  HEENT: Head atraumatic, normocephalic. Oropharynx and nasopharynx clear.  NECK:  Supple, no jugular venous distention. No thyroid enlargement, no tenderness.  LUNGS: Normal breath sounds bilaterally, no wheezing, rales,rhonchi or crepitation. No use of accessory muscles of respiration.  CARDIOVASCULAR: S1, S2 normal. No murmurs, rubs, or gallops.  ABDOMEN: Soft, nontender, nondistended. Bowel sounds present. No organomegaly or mass.  EXTREMITIES: No pedal edema, cyanosis, or clubbing. Right lower extremity is in the cast NEUROLOGIC: Cranial nerves II through XII are intact. Muscle strength 5/5 in all extremities. Sensation intact. Gait not checked.  PSYCHIATRIC:  The patient is alert and oriented x 3.  SKIN: No obvious rash, lesion, or ulcer.   ORDERS/RESULTS REVIEWED:   CBC  Recent Labs Lab 06/26/15 2120 06/27/15 0513  WBC 8.0 8.4  HGB 12.0 11.4*  HCT 36.2 33.8*  PLT 217 212  MCV 86.2 86.0  MCH 28.6 28.9  MCHC 33.2 33.6  RDW 15.2* 15.2*  LYMPHSABS 1.7  --   MONOABS 0.7  --   EOSABS 0.2  --   BASOSABS 0.1  --    ------------------------------------------------------------------------------------------------------------------  Chemistries   Recent Labs Lab 06/26/15 2120 06/27/15 0513  NA 137 139  K 3.5 3.5  CL 101 106  CO2 26 24  GLUCOSE 91 102*  BUN 29* 24*  CREATININE 1.36* 1.20*  CALCIUM 8.9 8.5*  AST 18  --   ALT 13*  --   ALKPHOS 73  --   BILITOT 0.4  --    ------------------------------------------------------------------------------------------------------------------ estimated creatinine clearance is 21.5 mL/min (by C-G formula based on Cr of 1.2). ------------------------------------------------------------------------------------------------------------------ No results for input(s): TSH, T4TOTAL, T3FREE, THYROIDAB in the last 72 hours.  Invalid input(s): FREET3  Cardiac Enzymes  Recent Labs Lab 06/26/15 2120  TROPONINI <0.03   ------------------------------------------------------------------------------------------------------------------ Invalid input(s): POCBNP ---------------------------------------------------------------------------------------------------------------  RADIOLOGY: Dg Chest 2 View  06/26/2015  CLINICAL DATA:  82 year old female with high blood pressure. No chest symptoms. EXAM: CHEST  2 VIEW COMPARISON:  None. FINDINGS: Two views of the chest demonstrate emphysematous changes of the lungs. There is mild blunting of the costophrenic angles which may be related to hyperinflation or scarring versus trace pleural effusion. There is no focal consolidation or pneumothorax. The cardiac  silhouette is within normal limits. There is osteopenia with degenerative changes of the  spine. No acute fracture. Surgical clips noted in the upper abdomen. IMPRESSION: No active cardiopulmonary disease. Electronically Signed   By: Anner Crete M.D.   On: 06/26/2015 22:08   Ct Head Wo Contrast  06/26/2015  CLINICAL DATA:  Pt states she feels shaky and as if she is that she is falling while she is laying flat. Pt recently broke her R leg. Pt is living in rehab facility presently since her leg was broken. No hx of stroke or seizure. Hx of cervical cancer. EXAM: CT HEAD WITHOUT CONTRAST TECHNIQUE: Contiguous axial images were obtained from the base of the skull through the vertex without intravenous contrast. COMPARISON:  None. FINDINGS: No acute intracranial hemorrhage. No focal mass lesion. No CT evidence of acute infarction. No midline shift or mass effect. No hydrocephalus. Basilar cisterns are patent. There are periventricular and subcortical white matter hypodensities. Generalized cortical atrophy. Paranasal sinuses and mastoid air cells are clear. Orbits are clear. IMPRESSION: No acute intracranial findings. Chronic atrophy and white matter microvascular disease. Electronically Signed   By: Suzy Bouchard M.D.   On: 06/26/2015 21:51   US Renal  06/27/2015  CLINICAL DATA:  Renal insufficiency. EXAM: RENAL / URINARY TRACT ULTRASOUND COMPLETE COMPARISON:  None. FINDINGS: Scan is limited by patient related factors. Right Kidney: Length: 9.3 cm. Echogenicity within normal limits. No mass or hydronephrosis visualized. Left Kidney: Length: 8.6 cm. Limited visualization of the left kidney due to overlying acoustic shadowing from ribs/bowel-gas. Echogenicity within normal limits. No mass or hydronephrosis visualized. Bladder: Appears normal for degree of bladder distention. IMPRESSION: 1. Limited scan. No hydronephrosis. Small and otherwise grossly unremarkable kidneys. 2. Normal bladder. Electronically  Signed   By: Traci Vaughan M.D.   On: 06/27/2015 09:21    EKG:  Orders placed or performed during the hospital encounter of 06/26/15  . ED EKG  . ED EKG  . EKG    ASSESSMENT AND PLAN:  Principal Problem:   UTI (lower urinary tract infection) Active Problems:   Dizziness #1. Urinary tract infection, blood cultures negative so far. Urinary cultures are pending, continue patient on levofloxacin, awaiting for culture results #2. Altered mental status, confusion, likely metabolic encephalopathy due to UTI, supportive therapy, follow clinically #3. Right tibia fracture, cast, pain medications, get physical therapist involved for further recommendations, possible rehabilitation placement #4. Dizziness, physical therapist evaluation, may need to rehabilitation, follow closely, get orthostatic vital signs to rule out orthostatic hypotension, as patient looks dehydrated clinically  #5. Acute renal insufficiency, improved with IV fluids, renal ultrasound was unremarkable, no hydronephrosis, follow creatinine in the morning   Management plans discussed with the patient, family and they are in agreement.   DRUG ALLERGIES:  Allergies  Allergen Reactions  . Levothyroxine Sodium Other (See Comments)    CODE STATUS:     Code Status Orders        Start     Ordered   06/27/15 0051  Do not attempt resuscitation (DNR)   Continuous    Question Answer Comment  In the event of cardiac or respiratory ARREST Do not call a "code blue"   In the event of cardiac or respiratory ARREST Do not perform Intubation, CPR, defibrillation or ACLS   In the event of cardiac or respiratory ARREST Use medication by any route, position, wound care, and other measures to relive pain and suffering. May use oxygen, suction and manual treatment of airway obstruction as needed for comfort.      06/27/15 0050  Code Status History    Date Active Date Inactive Code Status Order ID Comments User Context   This  patient has a current code status but no historical code status.    Advance Directive Documentation        Most Recent Value   Type of Advance Directive  Healthcare Power of Attorney   Pre-existing out of facility DNR order (yellow form or pink MOST form)     "MOST" Form in Place?        TOTAL TIME TAKING CARE OF THIS PATIENT: 40  minutes.  Discussed with care management  Kattie Santoyo M.D on 06/27/2015 at 3:45 PM  Between 7am to 6pm - Pager - [phone]  After 6pm go to [email or web address] - password [email or web address] [place] [facility]  Office  [phone]  CC: Primary care physician; Albina Billet, MD

## 2015-06-27 NOTE — Progress Notes (Signed)
  Pharmacy Antibiotic Note  Traci Vaughan is a 81 y.o. female admitted on 06/26/2015 with UTI.  Pharmacy has been consulted for levofloxacin dosing.  Plan: Levofloxacin 250 mg IV Q24H  Height: 5\' 4"  (162.6 cm) Weight: 107 lb (48.535 kg) IBW/kg (Calculated) : 54.7  Temp (24hrs), Avg:98 F (36.7 C), Min:98 F (36.7 C), Max:98 F (36.7 C)   Recent Labs Lab 06/26/15 2120  WBC 8.0  CREATININE 1.36*    Estimated Creatinine Clearance: 18.9 mL/min (by C-G formula based on Cr of 1.36).    Allergies  Allergen Reactions  . Levothyroxine Sodium Other (See Comments)    Thank you for allowing pharmacy to be a part of this patient's care.  Laural Benes, Pharm.D., BCPS Clinical Pharmacist 06/27/2015 12:34 AM

## 2015-06-27 NOTE — Plan of Care (Signed)
RN contracted [place] to cancel pt appt for 6/26 at 10AM, per family request, since currently in hospital.  Family will need to call office tomorrow to reschedule as possible. Family informed.  SW also asked to contact family with regards to insurance questions.

## 2015-06-28 ENCOUNTER — Telehealth: Payer: Self-pay | Admitting: *Deleted

## 2015-06-28 LAB — URINE CULTURE: Culture: NO GROWTH

## 2015-06-28 LAB — BASIC METABOLIC PANEL
Anion gap: 9 (ref 5–15)
BUN: 22 mg/dL — AB (ref 6–20)
CALCIUM: 8.8 mg/dL — AB (ref 8.9–10.3)
CO2: 25 mmol/L (ref 22–32)
Chloride: 103 mmol/L (ref 101–111)
Creatinine, Ser: 1.27 mg/dL — ABNORMAL HIGH (ref 0.44–1.00)
GFR calc Af Amer: 40 mL/min — ABNORMAL LOW (ref >60)
GFR, EST NON AFRICAN AMERICAN: 35 mL/min — AB (ref >60)
GLUCOSE: 91 mg/dL (ref 65–99)
Potassium: 3.4 mmol/L — ABNORMAL LOW (ref 3.5–5.1)
Sodium: 137 mmol/L (ref 135–145)

## 2015-06-28 LAB — MRSA PCR SCREENING: MRSA by PCR: NEGATIVE

## 2015-06-28 LAB — HEMOGLOBIN: Hemoglobin: 11.5 g/dL — ABNORMAL LOW (ref 12.0–16.0)

## 2015-06-28 MED ORDER — ASPIRIN 325 MG PO TABS
325.0000 mg | ORAL_TABLET | Freq: Every day | ORAL | Status: DC
  Administered 2015-06-28 – 2015-06-30 (×3): 325 mg via ORAL
  Filled 2015-06-28 (×3): qty 1

## 2015-06-28 MED ORDER — MECLIZINE HCL 25 MG PO TABS: 25.0000 mg | ORAL_TABLET | Freq: Three times a day (TID) | ORAL | Status: DC | PRN

## 2015-06-28 MED ORDER — POTASSIUM CHLORIDE CRYS ER 20 MEQ PO TBCR
40.0000 meq | EXTENDED_RELEASE_TABLET | Freq: Once | ORAL | Status: AC
  Administered 2015-06-28: 40 meq via ORAL
  Filled 2015-06-28: qty 2

## 2015-06-28 NOTE — Progress Notes (Signed)
Bone Stimulator has been applied. Per rep the box has to go back with patient to [facility]. RN aware of above. Clinical Social Worker (CSW) will continue to follow and assist as needed.   Blima Rich, LCSW [phone]

## 2015-06-28 NOTE — Progress Notes (Signed)
Patient was to have bone stimulator applied in office this am. Rep will be applying it today in room.

## 2015-06-28 NOTE — Progress Notes (Signed)
[facility] [facility] [place] NAME: Traci Vaughan    MR#:  [id number]  DATE OF BIRTH:  07/10/1918  SUBJECTIVE:  CHIEF COMPLAINT:   Chief Complaint  Patient presents with  . Dizziness  Patient is a 82 year old female with past medical history significant for history of recent fall about 2 weeks ago with right tibial fracture, currently in cast who presents to the hospital with complaints of lightheadedness, dizziness, unsteady gait, generalized weakness. On arrival to the hospital patient was noted to be confused. Her labs revealed renal insufficiency, acute, thrombocytopenia, pyuria, concerning for UTI. Patient was admitted to the hospital for further evaluation and treatment. She feels well today, Very confused today, did not improve since yesterday. Urinary cultures are negative. Patient complained of some dizziness, lightheadedness earlier today. Prior CT scan of head was unremarkable.  Review of Systems  Unable to perform ROS: mental status change    VITAL SIGNS: Blood pressure 115/56, pulse 91, temperature 98.4 F (36.9 C), temperature source Oral, resp. rate 18, height 5\' 4"  (1.626 m), weight 48.535 kg (107 lb), SpO2 98 %.  PHYSICAL EXAMINATION:   GENERAL:  82 y.o.-year-old patient lying in the bed with no acute distress.  EYES: Pupils equal, round, reactive to light and accommodation. No scleral icterus. Extraocular muscles intact.  HEENT: Head atraumatic, normocephalic. Oropharynx and nasopharynx clear.  NECK:  Supple, no jugular venous distention. No thyroid enlargement, no tenderness.  LUNGS: Normal breath sounds bilaterally, no wheezing, rales,rhonchi or crepitation. No use of accessory muscles of respiration.  CARDIOVASCULAR: S1, S2 normal. No murmurs, rubs, or gallops.  ABDOMEN: Soft, nontender, nondistended. Bowel sounds present. No organomegaly or mass.  EXTREMITIES: No pedal edema, cyanosis, or clubbing. Right lower extremity is  in the cast NEUROLOGIC: Cranial nerves II through XII are intact. Muscle strength 5/5 in all extremities. Sensation intact. Gait not checked.  PSYCHIATRIC: The patient is alert and oriented x 3.  SKIN: No obvious rash, lesion, or ulcer.   ORDERS/RESULTS REVIEWED:   CBC  Recent Labs Lab 06/26/15 2120 06/27/15 0513 06/28/15 0400  WBC 8.0 8.4  --   HGB 12.0 11.4* 11.5*  HCT 36.2 33.8*  --   PLT 217 212  --   MCV 86.2 86.0  --   MCH 28.6 28.9  --   MCHC 33.2 33.6  --   RDW 15.2* 15.2*  --   LYMPHSABS 1.7  --   --   MONOABS 0.7  --   --   EOSABS 0.2  --   --   BASOSABS 0.1  --   --    ------------------------------------------------------------------------------------------------------------------  Chemistries   Recent Labs Lab 06/26/15 2120 06/27/15 0513 06/28/15 0400  NA 137 139 137  K 3.5 3.5 3.4*  CL 101 106 103  CO2 26 24 25   GLUCOSE 91 102* 91  BUN 29* 24* 22*  CREATININE 1.36* 1.20* 1.27*  CALCIUM 8.9 8.5* 8.8*  AST 18  --   --   ALT 13*  --   --   ALKPHOS 73  --   --   BILITOT 0.4  --   --    ------------------------------------------------------------------------------------------------------------------ estimated creatinine clearance is 20.3 mL/min (by C-G formula based on Cr of 1.27). ------------------------------------------------------------------------------------------------------------------ No results for input(s): TSH, T4TOTAL, T3FREE, THYROIDAB in the last 72 hours.  Invalid input(s): FREET3  Cardiac Enzymes  Recent Labs Lab 06/26/15 2120  TROPONINI <0.03   ------------------------------------------------------------------------------------------------------------------ Invalid input(s): POCBNP ---------------------------------------------------------------------------------------------------------------  RADIOLOGY: Dg Chest  2 View  06/26/2015  CLINICAL DATA:  82 year old female with high blood pressure. No chest symptoms. EXAM: CHEST   2 VIEW COMPARISON:  None. FINDINGS: Two views of the chest demonstrate emphysematous changes of the lungs. There is mild blunting of the costophrenic angles which may be related to hyperinflation or scarring versus trace pleural effusion. There is no focal consolidation or pneumothorax. The cardiac silhouette is within normal limits. There is osteopenia with degenerative changes of the spine. No acute fracture. Surgical clips noted in the upper abdomen. IMPRESSION: No active cardiopulmonary disease. Electronically Signed   By: Anner Crete M.D.   On: 06/26/2015 22:08   Ct Head Wo Contrast  06/26/2015  CLINICAL DATA:  Pt states she feels shaky and as if she is that she is falling while she is laying flat. Pt recently broke her R leg. Pt is living in rehab facility presently since her leg was broken. No hx of stroke or seizure. Hx of cervical cancer. EXAM: CT HEAD WITHOUT CONTRAST TECHNIQUE: Contiguous axial images were obtained from the base of the skull through the vertex without intravenous contrast. COMPARISON:  None. FINDINGS: No acute intracranial hemorrhage. No focal mass lesion. No CT evidence of acute infarction. No midline shift or mass effect. No hydrocephalus. Basilar cisterns are patent. There are periventricular and subcortical white matter hypodensities. Generalized cortical atrophy. Paranasal sinuses and mastoid air cells are clear. Orbits are clear. IMPRESSION: No acute intracranial findings. Chronic atrophy and white matter microvascular disease. Electronically Signed   By: Suzy Bouchard M.D.   On: 06/26/2015 21:51   US Renal  06/27/2015  CLINICAL DATA:  Renal insufficiency. EXAM: RENAL / URINARY TRACT ULTRASOUND COMPLETE COMPARISON:  None. FINDINGS: Scan is limited by patient related factors. Right Kidney: Length: 9.3 cm. Echogenicity within normal limits. No mass or hydronephrosis visualized. Left Kidney: Length: 8.6 cm. Limited visualization of the left kidney due to overlying  acoustic shadowing from ribs/bowel-gas. Echogenicity within normal limits. No mass or hydronephrosis visualized. Bladder: Appears normal for degree of bladder distention. IMPRESSION: 1. Limited scan. No hydronephrosis. Small and otherwise grossly unremarkable kidneys. 2. Normal bladder. Electronically Signed   By: Ilona Sorrel M.D.   On: 06/27/2015 09:21    EKG:  Orders placed or performed during the hospital encounter of 06/26/15  . ED EKG  . ED EKG  . EKG    ASSESSMENT AND PLAN:  Principal Problem:   UTI (lower urinary tract infection) Active Problems:   Dizziness #1. Pyuria, sterile, no evidence of urinary tract infection, as blood And urine cultures are negative so far. Levofloxacin is discontinued #2. Altered mental status, confusion, initially was felt to be due to metabolic encephalopathy/UTI, however, since patient's urinary cultures are negative, we are going to get MRI of the brain to rule out stroke in the posterior fossa causing patient's mental status change and dizziness, continue supportive therapy, initiate aspirin therapy, follow clinically, discussed with patient's son extensively, all questions were answered #3. Right tibia fracture, cast, pain medications, appreciate had bone stimulator placed today, awaiting for physical therapist recommendations, possible rehabilitation placement #4. Dizziness, waiting for physical therapist evaluation, may need to rehabilitation, follow closely, get orthostatic vital signs to rule out orthostatic hypotension, radius today, did not receive today, getting MRI of the brain to rule out stroke, initiate aspirin, meclizine #5. Chronic renal insufficiency, renal ultrasound was unremarkable, no hydronephrosis, at baseline   Management plans discussed with the patient, family and they are in agreement.   DRUG ALLERGIES:  Allergies  Allergen Reactions  . Levothyroxine Sodium Other (See Comments)    CODE STATUS:     Code Status Orders         Start     Ordered   06/27/15 0051  Do not attempt resuscitation (DNR)   Continuous    Question Answer Comment  In the event of cardiac or respiratory ARREST Do not call a "code blue"   In the event of cardiac or respiratory ARREST Do not perform Intubation, CPR, defibrillation or ACLS   In the event of cardiac or respiratory ARREST Use medication by any route, position, wound care, and other measures to relive pain and suffering. May use oxygen, suction and manual treatment of airway obstruction as needed for comfort.      06/27/15 0050    Code Status History    Date Active Date Inactive Code Status Order ID Comments User Context   This patient has a current code status but no historical code status.    Advance Directive Documentation        Most Recent Value   Type of Advance Directive  Healthcare Power of Attorney   Pre-existing out of facility DNR order (yellow form or pink MOST form)     "MOST" Form in Place?        TOTAL TIME TAKING CARE OF THIS PATIENT: 40  minutes.  Discussed with Patient's son extensively. All questions were answered  Missey Hasley M.D on 06/28/2015 at 5:40 PM  Between 7am to 6pm - Pager - [phone]  After 6pm go to [email or web address] - password [email or web address] [facility]  Office  [phone]  CC: Primary care physician; Albina Billet, MD

## 2015-06-28 NOTE — Progress Notes (Signed)
Pt is alert with confusion and hallucinations, on room air, undergoing treatment for UTI, son at bedside and updated on plan of care, son concerned over persistent confusion, head MRi ordered but not performed today due to bone stimulator implanted in cast. Incontinent of urine/stool, bm throughout shift, up in chair throughout shift.

## 2015-06-28 NOTE — Progress Notes (Signed)
Dr. Rudene Christians has ordered a bone stimulator to be applied today. Attending MD stated that ortho would have to be in charge of the care for the bone stimulator. CSW made Ortho Director aware of above. Per Director bone stimulator can be applied by the sales rep as long as a physician orders it. Dr. Rudene Christians did place an order for it. CSW will continue to follow and assist as needed.   Blima Rich, LCSW [phone]

## 2015-06-28 NOTE — Progress Notes (Signed)
Pt had an order for orthostatic vital signs to be taken every 4 hrs. Pt has a hard cast on the right leg and barely can stand up still. Attempted to take vital signs while standing with 3 assist but pt could not tolerate.

## 2015-06-28 NOTE — Telephone Encounter (Signed)
[facility] Night - Client Nonclinical Telephone Record [facility] Night - Client Client Site [place] - Night Physician Viviana Simpler - MD Contact Type Call Call [place] Page Now Who Is [place] / [place] Name [facility] Name [place] Number [phone] Relationship To Patient Care [place] Patient Name Traci Vaughan Patient DOB [birth year] Reason for Call Request to speak to Physician Initial Comment [name] @ [place] [phone] Needs orders from the on call to send pt to the ER. Additional Comment Needs to speak to on call about orders for the patient to be sent to the hospital. [facility] Phone DateTime Result/Outcome Message Type Notes Shanon Ace - MD [id number] 06/26/2015 8:38:59 PM Called On Call Provider - Reached Doctor Paged Shanon Ace - MD 06/26/2015 8:39:09 PM Spoke with On Call - General Message Result Call Closed By: Honor Loh Transaction Date/Time: 06/26/2015 8:16:27 PM (ET)

## 2015-06-28 NOTE — Telephone Encounter (Signed)
Admitted Will follow her status at [facility]

## 2015-06-28 NOTE — Progress Notes (Signed)
Per Donley Redder- representative, bone stimulator is plastic and Velcro and safe for MRI.

## 2015-06-28 NOTE — Evaluation (Signed)
Physical Therapy Evaluation Patient Details Name: Traci Vaughan MRN: [id number] DOB: 1918/04/06 Today's Date: 06/28/2015   History of Present Illness  Pt admitted for UTI and dizziness. Pt with history of fall 2 weeks ago with R tibial fx. Per notes from Ortho MD at that time, pt was NWB on R LE. Pt now with cast applied and reports she has been WB on that leg.  Clinical Impression  Pt is a pleasant 82 year old female who was admitted for UTI and dizziness. Pt performs bed mobility with min assist and transfers to recliner with max/total assist. Pt unable to maintain correct WB status on R LE. Pt demonstrates deficits with strength/pain/cognition. Would benefit from skilled PT to address above deficits and promote optimal return to PLOF.      Follow Up Recommendations SNF    Equipment Recommendations       Recommendations for Other Services       Precautions / Restrictions Precautions Precautions: Fall Restrictions Weight Bearing Restrictions: No      Mobility  Bed Mobility Overal bed mobility: Needs Assistance Bed Mobility: Supine to Sit     Supine to sit: Min assist     General bed mobility comments: assist for sliding B LE off of bed and trunk support. Once seated at EOB, pt with heavy post leaning noted, however is corrected with verbal/manual cues.   Transfers Overall transfer level: Needs assistance Equipment used: Rolling walker (2 wheeled) Transfers: Sit to/from Stand Sit to Stand: Max assist;+2 physical assistance         General transfer comment: 1st attempt, pt able to stand, however unable to maintain R NWB. 2nd attempt therapist performed stand pivot transfer to recliner with total assist + 2. Pt reported no increased pain.  Ambulation/Gait                Stairs            Wheelchair Mobility    Modified Rankin (Stroke Patients Only)       Balance Overall balance assessment: History of Falls;Needs assistance Sitting-balance  support: Feet supported Sitting balance-Leahy Scale: Poor     Standing balance support: Bilateral upper extremity supported Standing balance-Leahy Scale: Poor                               Pertinent Vitals/Pain Pain Assessment: Faces Faces Pain Scale: Hurts a little bit Pain Location: R LE with movement' Pain Descriptors / Indicators: Aching Pain Intervention(s): Limited activity within patient's tolerance    Home Living Family/patient expects to be discharged to:: Skilled nursing facility                      Prior Function Level of Independence: Needs assistance   Gait / Transfers Assistance Needed: unable to maintain WB status during mobility           Hand Dominance        Extremity/Trunk Assessment   Upper Extremity Assessment: Generalized weakness (B UE grossly 3+/5)           Lower Extremity Assessment: Generalized weakness (L LE grossly 4/5; R LE grossly 3/5)         Communication   Communication: No difficulties  Cognition Arousal/Alertness: Awake/alert Behavior During Therapy: WFL for tasks assessed/performed Overall Cognitive Status: Impaired/Different from baseline  General Comments      Exercises Other Exercises Other Exercises: Pt performed seated ther-ex on L LE including ankle pumps, heel slides, SLRs, and hip abd/add. All ther-ex performed x 10 reps with min assist. Safe technique performed.      Assessment/Plan    PT Assessment Patient needs continued PT services  PT Diagnosis Difficulty walking;Abnormality of gait;Generalized weakness;Acute pain   PT Problem List Decreased strength;Decreased balance;Decreased mobility;Pain  PT Treatment Interventions DME instruction;Gait training;Therapeutic exercise   PT Goals (Current goals can be found in the Care Plan section) Acute Rehab PT Goals Patient Stated Goal: to walk again PT Goal Formulation: With patient Time For Goal Achievement:  07/12/15 Potential to Achieve Goals: Fair    Frequency Min 2X/week   Barriers to discharge        Co-evaluation               End of Session Equipment Utilized During Treatment: Gait belt Activity Tolerance: Patient tolerated treatment well Patient left: in chair;with chair alarm set Nurse Communication: Mobility status         Time: [id number] PT Time Calculation (min) (ACUTE ONLY): 19 min   Charges:   PT Evaluation $PT Eval Moderate Complexity: 1 Procedure PT Treatments $Therapeutic Exercise: 8-22 mins   PT G Codes:        Zae Kirtz 2015/07/24, 10:59 AM  Greggory Stallion, PT, DPT [phone]

## 2015-06-28 NOTE — Progress Notes (Signed)
Per Seth Bake admissions coordinator at [facility] patient can return when stable. Patient changed to inpatient on 06/27/15. Patient is currently private pay at [facility] however if patient has a 3 night inpatient qualifying stay then patient can return under her Medicare benefit. CSW will continue to follow and assist as needed.   Blima Rich, LCSW [phone]

## 2015-06-28 NOTE — Progress Notes (Signed)
   06/28/15 0935  Clinical Encounter Type  Visited With Patient  Visit Type Initial  Consult/Referral To Chaplain  Recommendations pt was in good spirits and optimistic of future status.  Spiritual Encounters  Spiritual Needs Prayer  Stress Factors  Patient Stress Factors Health changes

## 2015-06-28 NOTE — Progress Notes (Signed)
Received a call from Lab that swab sent for MRSA screen was invalid and unable to give result. New swab obtained nasally, properly labeled and sent to lab this time. Still awaiting for results.

## 2015-06-29 ENCOUNTER — Inpatient Hospital Stay: Payer: Medicare Other

## 2015-06-29 NOTE — Plan of Care (Addendum)
Dr. Rudene Christians returned call and gave verbal orders to remove bone stimulator and have MRI completed.  Dr. Hal Hope instructed RN how to remove stimulator and remained on phone while accomplished.  Stimulator to be re-applied tomorrow AM w/ new pad. Traci Vaughan) informed of pt current POC and progress.

## 2015-06-29 NOTE — Progress Notes (Signed)
[place] [facility] [place] NAME: Traci Vaughan    MR#:  [id number]  DATE OF BIRTH:  [birth year]  SUBJECTIVE:  CHIEF COMPLAINT:   Chief Complaint  Patient presents with  . Dizziness  Patient is a 82 year old female with past medical history significant for history of recent fall about 2 weeks ago with right tibial fracture, currently in cast who presents to the hospital with complaints of lightheadedness, dizziness, unsteady gait, generalized weakness. Does not complain of dizziness today.  Review of Systems  Unable to perform ROS: mental status change  No dizziness No chest pain  VITAL SIGNS: Blood pressure 145/62, pulse 73, temperature 98.1 F (36.7 C), temperature source Oral, resp. rate 18, height 5\' 4"  (1.626 m), weight 48.535 kg (107 lb), SpO2 99 %.  PHYSICAL EXAMINATION:   GENERAL:  82 y.o.-year-old patient lying in the bed with no acute distress.  EYES: Pupils equal, round, reactive to light and accommodation. No scleral icterus. Extraocular muscles intact.  HEENT: Head atraumatic, normocephalic. Oropharynx and nasopharynx clear.  NECK:  Supple, no jugular venous distention. No thyroid enlargement, no tenderness.  LUNGS: Normal breath sounds bilaterally, no wheezing, rales,rhonchi or crepitation. No use of accessory muscles of respiration.  CARDIOVASCULAR: S1, S2 normal. No murmurs, rubs, or gallops.  ABDOMEN: Soft, nontender, nondistended. Bowel sounds present. No organomegaly or mass.  EXTREMITIES: No pedal edema,  NEUROLOGIC: Cranial nerves II through XII are intact. Muscle strength 5/5 in all extremities.   PSYCHIATRIC: The patient is alert and oriented x 2. Thinks she is 82 years old SKIN: No obvious rash, lesion, or ulcer.   ORDERS/RESULTS REVIEWED:   CBC  Recent Labs Lab 06/26/15 2120 06/27/15 0513 06/28/15 0400  WBC 8.0 8.4  --   HGB 12.0 11.4* 11.5*  HCT 36.2 33.8*  --   PLT 217 212  --   MCV 86.2 86.0  --    MCH 28.6 28.9  --   MCHC 33.2 33.6  --   RDW 15.2* 15.2*  --   LYMPHSABS 1.7  --   --   MONOABS 0.7  --   --   EOSABS 0.2  --   --   BASOSABS 0.1  --   --    ------------------------------------------------------------------------------------------------------------------  Chemistries   Recent Labs Lab 06/26/15 2120 06/27/15 0513 06/28/15 0400  NA 137 139 137  K 3.5 3.5 3.4*  CL 101 106 103  CO2 26 24 25   GLUCOSE 91 102* 91  BUN 29* 24* 22*  CREATININE 1.36* 1.20* 1.27*  CALCIUM 8.9 8.5* 8.8*  AST 18  --   --   ALT 13*  --   --   ALKPHOS 73  --   --   BILITOT 0.4  --   --    ------------------------------------------------------------------------------------------------------------------ estimated creatinine clearance is 20.3 mL/min (by C-G formula based on Cr of 1.27). ------------------------------------------------------------------------------------------------------------------ No results for input(s): TSH, T4TOTAL, T3FREE, THYROIDAB in the last 72 hours.  Invalid input(s): FREET3  Cardiac Enzymes  Recent Labs Lab 06/26/15 2120  TROPONINI <0.03   ------------------------------------------------------------------------------------------------------------------ Invalid input(s): POCBNP ---------------------------------------------------------------------------------------------------------------  RADIOLOGY: No results found.  EKG:  Orders placed or performed during the hospital encounter of 06/26/15  . ED EKG  . ED EKG  . EKG    ASSESSMENT AND PLAN:  Principal Problem:   UTI (lower urinary tract infection) Active Problems:   Dizziness #1. Pyuria, sterile, no evidence of urinary tract infection, as blood And urine cultures are negative  so far. Levofloxacin is discontinued yesterday. #2. Altered mental status, confusion, initially was felt to be due to metabolic encephalopathy/UTI, however, since patient's urinary cultures are negative, we are going  to get MRI of the brain to rule out stroke in the posterior fossa causing patient's mental status change and dizziness, continue supportive therapy, initiate aspirin therapy, follow clinically, discussed with patient's son extensively, all questions were answered. Hopefully for MRI this afternoon. #3. Right tibia fracture, cast, pain medications, appreciate had bone stimulator placed today, awaiting for physical therapist recommendations, possible rehabilitation placement #4. Dizziness,No dizziness reported today. MRI scheduled later today to rule out stroke. Full dose ASA started while awaiting results. #5. Chronic renal insufficiency, renal ultrasound was unremarkable, no hydronephrosis, at baseline   Management plans discussed with the patient, family and they are in agreement.   DRUG ALLERGIES:  Allergies  Allergen Reactions  . Levothyroxine Sodium Other (See Comments)    CODE STATUS:     Code Status Orders        Start     Ordered   06/27/15 0051  Do not attempt resuscitation (DNR)   Continuous    Question Answer Comment  In the event of cardiac or respiratory ARREST Do not call a "code blue"   In the event of cardiac or respiratory ARREST Do not perform Intubation, CPR, defibrillation or ACLS   In the event of cardiac or respiratory ARREST Use medication by any route, position, wound care, and other measures to relive pain and suffering. May use oxygen, suction and manual treatment of airway obstruction as needed for comfort.      06/27/15 0050    Code Status History    Date Active Date Inactive Code Status Order ID Comments User Context   This patient has a current code status but no historical code status.    Advance Directive Documentation        Most Recent Value   Type of Advance Directive  Healthcare Power of Attorney   Pre-existing out of facility DNR order (yellow form or pink MOST form)     "MOST" Form in Place?        TOTAL TIME TAKING CARE OF THIS PATIENT:  25  minutes.  Discussed with Patient's son extensively. All questions were answered  Dana Allan.D on 06/29/2015 at 1:20 PM  Between 7am to 6pm - Pager - [phone]  After 6pm go to [email or web address] - password [email or web address] [place]  Office  [phone]  CC: Primary care physician; Albina Billet, MD

## 2015-06-29 NOTE — Progress Notes (Signed)
Per RN in progression rounds radiology determined that the bone stimulator will have to be removed prior to patient getting an MRI. Clinical Education officer, museum (CSW) contacted [employer] rep who placed bone stimulator on patient yesterday. Per Lytle Michaels the removal is simple. Per Ortho director an order from an MD will have to be placed and Ortho or sales rep will have to remove it. Per sales rep he is in [place] all day and cannot remove it today. CSW also paged Dr. Rudene Christians to see if an order can be placed to remove it. CSW is waiting on call back from Dr. Rudene Christians. RN aware of above. CSW will continue to follow and assist as needed.   Blima Rich, LCSW [phone]

## 2015-06-29 NOTE — Plan of Care (Signed)
Spoke with Traci Vaughan [facility]), who confirmed, it is family decision to prioritize getting MRI completed.  Therefore, according to Radiology, bone stimulator will need to be removed, before MRI can be completed.  Attempting to reach ortho to request order to remove stimulator and also have them come to room to remove.  MRI will be contacted once stimulator is removed.

## 2015-06-29 NOTE — Care Management Important Message (Signed)
Important Message  Patient Details  Name: Traci Vaughan MRN: [id number] Date of Birth: 1918/10/13   Medicare Important Message Given:  Yes    [name] 06/29/2015, 11:24 AM

## 2015-06-30 MED ORDER — POTASSIUM CHLORIDE 20 MEQ/15ML (10%) PO SOLN
40.0000 meq | Freq: Once | ORAL | Status: AC
  Administered 2015-06-30: 40 meq via ORAL
  Filled 2015-06-30: qty 30

## 2015-06-30 MED ORDER — HYDROCODONE-ACETAMINOPHEN 5-325 MG PO TABS: 1.0000 | ORAL_TABLET | ORAL | Status: DC | PRN

## 2015-06-30 MED ORDER — ASPIRIN 325 MG PO TABS
325.0000 mg | ORAL_TABLET | Freq: Every day | ORAL | Status: AC
Start: 2015-06-30 — End: ?

## 2015-06-30 NOTE — Progress Notes (Signed)
Patient is medically stable for D/C to [facility] today. Patient was admitted to inpatient on 06/27/15 and has met the 3 night inpatient stay criteria and no longer has to pay privately for [facility]. Per Seth Bake admissions coordinator at [facility] patient can return to room 322. RN will call report at [phone] and arrange EMS for transport. Clinical Education officer, museum (CSW) sent D/C Summary, FL2 and D/C Packet to [employer] via [employer]. Patient is aware of above. CSW contacted patient's son Traci Vaughan and made him aware of above. Please reconsult if future social work needs arise. CSW signing off.   Blima Rich, LCSW [phone]

## 2015-06-30 NOTE — Progress Notes (Signed)
Physical Therapy Treatment Patient Details Name: Traci Vaughan MRN: [id number] DOB: 1918/08/24 Today's Date: 06/30/2015    History of Present Illness Pt admitted for UTI and dizziness. Pt with history of fall 2 weeks ago with R tibial fx. Per notes from Ortho MD at that time, pt was NWB on R LE. Pt now with cast applied and reports she has been WB on that leg. Pt with negative MRI testing and now has bone stimulator applied to R LE.    PT Comments    Pt is making good progress towards goals with increased functional independence this date. Pt able to use RW for mobility while maintaining correct WB status to ambulate to chair. Good progress with there-ex. Pt appears less confused today and able to order breakfast. Pt appears motivated to participate and is excited about bone stimulator applied.  Follow Up Recommendations  SNF     Equipment Recommendations       Recommendations for Other Services       Precautions / Restrictions Precautions Precautions: Fall Restrictions Weight Bearing Restrictions: No    Mobility  Bed Mobility Overal bed mobility: Needs Assistance Bed Mobility: Supine to Sit     Supine to sit: Min assist     General bed mobility comments: assist for sliding B LE off bed and pt able to assist with trunk support and holding to rails.  Transfers Overall transfer level: Needs assistance Equipment used: Rolling walker (2 wheeled) Transfers: Sit to/from Stand Sit to Stand: Mod assist;+2 physical assistance         General transfer comment: Pt able to stand and maintain R NWB while using RW. Safe technique performed with upright posture. Cues for L knee extension to support weight.  Ambulation/Gait Ambulation/Gait assistance: Mod assist;+2 physical assistance Ambulation Distance (Feet): 5 Feet Assistive device: Rolling walker (2 wheeled) Gait Pattern/deviations: Step-to pattern     General Gait Details: Pt able to slide L foot and partially hop to  take steps to recliner. +2 assist required and cues for upright posture. Pt able to maintain R NWB, however fatigues quickly with ambulation   Stairs            Wheelchair Mobility    Modified Rankin (Stroke Patients Only)       Balance                                    Cognition Arousal/Alertness: Awake/alert Behavior During Therapy: WFL for tasks assessed/performed Overall Cognitive Status: Within Functional Limits for tasks assessed                      Exercises Other Exercises Other Exercises: Seated ther-ex performed on B LE including L ankle pumps, SAQ, quad sets, B hip abd/add, SLR, and hip add squeezes. All ther-ex performec x 10 reps with min assist on R LE and cga on L LE. Safe technique performed with cues for sequencing.    General Comments        Pertinent Vitals/Pain Pain Assessment: No/denies pain    Home Living                      Prior Function            PT Goals (current goals can now be found in the care plan section) Acute Rehab PT Goals Patient Stated Goal: to walk again PT Goal  Formulation: With patient Time For Goal Achievement: 07/12/15 Potential to Achieve Goals: Fair Progress towards PT goals: Progressing toward goals    Frequency  Min 2X/week    PT Plan Current plan remains appropriate    Co-evaluation             End of Session Equipment Utilized During Treatment: Gait belt Activity Tolerance: Patient tolerated treatment well Patient left: in chair;with chair alarm set     Time: [phone] PT Time Calculation (min) (ACUTE ONLY): 23 min  Charges:  $Gait Training: 8-22 mins $Therapeutic Exercise: 8-22 mins                    G Codes:      Jhamir Pickup 08-Jul-2015, 11:25 AM  Greggory Stallion, PT, DPT [phone]

## 2015-06-30 NOTE — Discharge Summary (Signed)
[place] [facility] [place] NAME: Traci Vaughan    MR#:  [id number]  DATE OF BIRTH:  10/13/1918  DATE OF ADMISSION:  06/26/2015 ADMITTING PHYSICIAN: Saundra Shelling, MD  DATE OF DISCHARGE: *06/30/2015  PRIMARY CARE PHYSICIAN: Albina Billet, MD    ADMISSION DIAGNOSIS:  Weakness [R53.1] Lightheaded [R42] UTI (lower urinary tract infection) [N39.0]  DISCHARGE DIAGNOSIS:  Principal Problem:   UTI (lower urinary tract infection) Active Problems:   Dizziness   SECONDARY DIAGNOSIS:   Past Medical History  Diagnosis Date  . Macular degeneration   . Thyroid disease   . Cancer [facility]) 1984    ovarian /Dr Lottie Rater [facility]  . Skin cancer     HOSPITAL COURSE:  82 y.o. female with a known history of Ovarian cancer, macular degeneration, thyroid disease presented to the emergency room with generalized weakness and dizziness.  1. AMS with acute encephalopathy: This is due to dehydration. She was evaluated for UTI, however urine culture was negative and she had MRI brain which was negative. Her symptoms resolved at admission after IVF.  2. Right Tib/Fib fracture after mechanical fall: She had bone stimulator placed during hospital stay. She will follow up with Dr Rudene Christians in 7-10 days.  3. CKD stage 3; creatinine stable. Renal ultrasound shows no hydronephrosis.   DISCHARGE CONDITIONS AND DIET:   Stable for discharge to SNF Regular diet  CONSULTS OBTAINED:     DRUG ALLERGIES:   Allergies  Allergen Reactions  . Levothyroxine Sodium Other (See Comments)    DISCHARGE MEDICATIONS:   Current Discharge Medication List    START taking these medications   Details  aspirin 325 MG tablet Take 1 tablet (325 mg total) by mouth daily. Qty: 30 tablet, Refills: 0      CONTINUE these medications which have CHANGED   Details  HYDROcodone-acetaminophen (NORCO/VICODIN) 5-325 MG tablet Take 1 tablet by mouth every 4 (four) hours as needed for moderate pain or  severe pain. Qty: 30 tablet, Refills: 0      CONTINUE these medications which have NOT CHANGED   Details  atorvastatin (LIPITOR) 10 MG tablet Take 10 mg by mouth daily at 6 PM.     Biotin 1 MG CAPS Take 2.5 tablets by mouth daily.     brimonidine (ALPHAGAN) 0.15 % ophthalmic solution Place 1 drop into both eyes 2 (two) times daily. Reported on 06/26/2015    Cholecalciferol (VITAMIN D-3 PO) Take 1,000 Units by mouth daily.     Cyanocobalamin (VITAMIN B 12 PO) Take 2,500 mcg by mouth daily.     felodipine (PLENDIL) 5 MG 24 hr tablet Take 5 mg by mouth daily.     Multiple Vitamins-Minerals (ZINC PO) Take 50 mg by mouth daily.     polyethylene glycol (MIRALAX / GLYCOLAX) packet Take 17 g by mouth daily.    PREMARIN 0.625 MG tablet Take 0.625 mg by mouth daily.     SYNTHROID 75 MCG tablet Take 75 mcg by mouth every other day. Mondays, Wednesdays, and Fridays.    timolol (TIMOPTIC) 0.5 % ophthalmic solution Place 1 drop into both eyes 2 (two) times daily.     triamterene-hydrochlorothiazide (MAXZIDE-25) 37.5-25 MG tablet Take 1 tablet by mouth daily.     vitamin E 400 UNIT capsule Take 400 Units by mouth daily.    Multiple Vitamins-Minerals (EYE VITAMINS PO) Take by mouth 2 (two) times daily. Reported on 06/26/2015      STOP taking these medications  amoxicillin (AMOXIL) 500 MG capsule      naproxen sodium (ANAPROX) 220 MG tablet               Today   CHIEF COMPLAINT:  Doing well this am no dizziness lightheadedness fever or chest pain   VITAL SIGNS:  Blood pressure 138/70, pulse 74, temperature 97.1 F (36.2 C), temperature source Axillary, resp. rate 18, height 5\' 4"  (1.626 m), weight 48.535 kg (107 lb), SpO2 100 %.   REVIEW OF SYSTEMS:  Review of Systems  Constitutional: Negative for fever, chills and malaise/fatigue.  HENT: Negative for ear discharge, ear pain, hearing loss, nosebleeds and sore throat.   Eyes: Negative for blurred vision and pain.   Respiratory: Negative for cough, hemoptysis, shortness of breath and wheezing.   Cardiovascular: Negative for chest pain, palpitations and leg swelling.  Gastrointestinal: Negative for nausea, vomiting, abdominal pain, diarrhea and blood in stool.  Genitourinary: Negative for dysuria.  Musculoskeletal: Negative for back pain.  Neurological: Negative for dizziness, tremors, speech change, focal weakness, seizures and headaches.  Endo/Heme/Allergies: Does not bruise/bleed easily.  Psychiatric/Behavioral: Negative for depression, suicidal ideas and hallucinations.     PHYSICAL EXAMINATION:  GENERAL:  82 y.o.-year-old patient lying in the bed with no acute distress.  NECK:  Supple, no jugular venous distention. No thyroid enlargement, no tenderness.  LUNGS: Normal breath sounds bilaterally, no wheezing, rales,rhonchi  No use of accessory muscles of respiration.  CARDIOVASCULAR: S1, S2 normal. No murmurs, rubs, or gallops.  ABDOMEN: Soft, non-tender, non-distended. Bowel sounds present. No organomegaly or mass.  EXTREMITIES: No pedal edema, cyanosis, or clubbing. She has cast placed right leg PSYCHIATRIC: The patient is alert and oriented x 3.  SKIN: bruising all over arms (old).   DATA REVIEW:   CBC  Recent Labs Lab 06/27/15 0513 06/28/15 0400  WBC 8.4  --   HGB 11.4* 11.5*  HCT 33.8*  --   PLT 212  --     Chemistries   Recent Labs Lab 06/26/15 2120  06/28/15 0400  NA 137  < > 137  K 3.5  < > 3.4*  CL 101  < > 103  CO2 26  < > 25  GLUCOSE 91  < > 91  BUN 29*  < > 22*  CREATININE 1.36*  < > 1.27*  CALCIUM 8.9  < > 8.8*  AST 18  --   --   ALT 13*  --   --   ALKPHOS 73  --   --   BILITOT 0.4  --   --   < > = values in this interval not displayed.  Cardiac Enzymes  Recent Labs Lab 06/26/15 2120  TROPONINI <0.03    Microbiology Results  @MICRORSLT48 @  RADIOLOGY:  Mr Brain Wo Contrast  06/29/2015  CLINICAL DATA:  Generalized weakness and dizziness. History  of ovarian cancer. EXAM: MRI HEAD WITHOUT CONTRAST TECHNIQUE: Multiplanar, multiecho pulse sequences of the brain and surrounding structures were obtained without intravenous contrast. COMPARISON:  06/26/2015 CT head. FINDINGS: The patient was unable to remain motionless for the exam. Small or subtle lesions could be overlooked. No evidence for acute infarction, hemorrhage, mass lesion, or extra-axial fluid. Global atrophy. Hydrocephalus ex vacuo. Chronic microvascular ischemic change of a moderate to severe nature. Chronic lacunar infarcts of the RIGHT thalamus, RIGHT basal ganglia, and RIGHT cerebellum. Flow voids are maintained. No chronic hemorrhage. Extracranial soft tissues unremarkable. Within limits for detection on noncontrast examination, no intracranial metastatic disease. IMPRESSION: Chronic changes  as described. No acute intracranial findings or mass are seen on this motion degraded exam. Electronically Signed   By: Staci Righter M.D.   On: 06/29/2015 15:40      Management plans discussed with the patient and she is in agreement. Stable for discharge SNF  Patient should follow up with PCP 1 week  CODE STATUS:     Code Status Orders        Start     Ordered   06/27/15 0051  Do not attempt resuscitation (DNR)   Continuous    Question Answer Comment  In the event of cardiac or respiratory ARREST Do not call a "code blue"   In the event of cardiac or respiratory ARREST Do not perform Intubation, CPR, defibrillation or ACLS   In the event of cardiac or respiratory ARREST Use medication by any route, position, wound care, and other measures to relive pain and suffering. May use oxygen, suction and manual treatment of airway obstruction as needed for comfort.      06/27/15 0050    Code Status History    Date Active Date Inactive Code Status Order ID Comments User Context   This patient has a current code status but no historical code status.    Advance Directive Documentation         Most Recent Value   Type of Advance Directive  Healthcare Power of Attorney   Pre-existing out of facility DNR order (yellow form or pink MOST form)     "MOST" Form in Place?        TOTAL TIME TAKING CARE OF THIS PATIENT: 35 minutes.    Note: This dictation was prepared with Dragon dictation along with smaller phrase technology. Any transcriptional errors that result from this process are unintentional.  Juvia Aerts M.D on 06/30/2015 at 10:37 AM  Between 7am to 6pm - Pager - [phone] After 6pm go to [email or web address] - password [facility]  Office  [phone]  CC: Primary care physician; Albina Billet, MD

## 2015-06-30 NOTE — Discharge Planning (Signed)
Pt IV removed. Report called to [facility]. Going to rm 322, s/w Threasa Heads, Therapist, sports.  RN assessment and VS revealed stability for DC to facility.  EMS called to transport and family also made aware.

## 2015-06-30 NOTE — Plan of Care (Signed)
Bone stimulator reapplied per orders, with prep for possible DC back to [facility].

## 2015-07-01 DIAGNOSIS — G459 Transient cerebral ischemic attack, unspecified: Secondary | ICD-10-CM | POA: Diagnosis not present

## 2015-07-01 DIAGNOSIS — R410 Disorientation, unspecified: Secondary | ICD-10-CM

## 2015-07-01 LAB — CULTURE, BLOOD (ROUTINE X 2)
CULTURE: NO GROWTH
CULTURE: NO GROWTH

## 2015-07-05 ENCOUNTER — Telehealth: Payer: Self-pay

## 2015-07-05 NOTE — Telephone Encounter (Signed)
PLEASE NOTE: All timestamps contained within this report are represented as [place] Standard Time. CONFIDENTIALTY NOTICE: This fax transmission is intended only for the addressee. It contains information that is legally privileged, confidential or otherwise protected from use or disclosure. If you are not the intended recipient, you are strictly prohibited from reviewing, disclosing, copying using or disseminating any of this information or taking any action in reliance on or regarding this information. If you have received this fax in error, please notify us immediately by telephone so that we can arrange for its return to [place]. Phone: [phone], Toll-Free: [phone], Fax: [phone] Page: 1 of 1 Call Id: [id number] [place] Night - Client Nonclinical Telephone Record [place] Night - Client Client Site [place] Physician Viviana Simpler - MD Contact Type Call Who Is Calling Physician / Provider / Hospital Call Type Provider Call [facility] Page Now Reason for Call Request to speak to Physician Initial Comment Caller states, Nurse needs to speak to the on call dr. Additional Comment Patient Name Traci Vaughan Patient DOB Dec 06, 1918 Requesting Provider [facility] Physician Number [place] Name [place] Phone DateTime Result/Outcome Message Type Notes Howard Pouch [id number] 07/02/2015 8:28:41 PM Called On Call Provider - Reached Doctor Paged Howard Pouch 07/02/2015 8:33:38 PM Spoke with On Call - General Message Result Call Closed By: Honor Loh Transaction Date/Time: 07/02/2015 8:20:42 PM (ET)

## 2015-07-05 NOTE — Telephone Encounter (Signed)
No reports of ongoing issues when I was there this morning

## 2015-07-13 DIAGNOSIS — S82201A Unspecified fracture of shaft of right tibia, initial encounter for closed fracture: Secondary | ICD-10-CM | POA: Diagnosis not present

## 2015-07-13 DIAGNOSIS — I10 Essential (primary) hypertension: Secondary | ICD-10-CM | POA: Diagnosis not present

## 2015-07-13 DIAGNOSIS — E039 Hypothyroidism, unspecified: Secondary | ICD-10-CM | POA: Diagnosis not present

## 2015-07-28 DIAGNOSIS — R2232 Localized swelling, mass and lump, left upper limb: Secondary | ICD-10-CM | POA: Diagnosis not present

## 2015-07-28 DIAGNOSIS — M25532 Pain in left wrist: Secondary | ICD-10-CM

## 2015-08-01 ENCOUNTER — Emergency Department: Payer: Medicare Other

## 2015-08-01 ENCOUNTER — Encounter: Payer: Self-pay | Admitting: Emergency Medicine

## 2015-08-01 ENCOUNTER — Emergency Department
Admission: EM | Admit: 2015-08-01 | Discharge: 2015-08-01 | Disposition: A | Discharge status: Home / Self Care | Payer: Medicare Other | Attending: Emergency Medicine | Admitting: Emergency Medicine

## 2015-08-01 DIAGNOSIS — Z85828 Personal history of other malignant neoplasm of skin: Secondary | ICD-10-CM | POA: Diagnosis not present

## 2015-08-01 DIAGNOSIS — Y999 Unspecified external cause status: Secondary | ICD-10-CM | POA: Insufficient documentation

## 2015-08-01 DIAGNOSIS — Y929 Unspecified place or not applicable: Secondary | ICD-10-CM | POA: Diagnosis not present

## 2015-08-01 DIAGNOSIS — Z7982 Long term (current) use of aspirin: Secondary | ICD-10-CM | POA: Diagnosis not present

## 2015-08-01 DIAGNOSIS — S0990XA Unspecified injury of head, initial encounter: Secondary | ICD-10-CM | POA: Diagnosis not present

## 2015-08-01 DIAGNOSIS — S51012A Laceration without foreign body of left elbow, initial encounter: Secondary | ICD-10-CM | POA: Insufficient documentation

## 2015-08-01 DIAGNOSIS — Z8543 Personal history of malignant neoplasm of ovary: Secondary | ICD-10-CM | POA: Insufficient documentation

## 2015-08-01 DIAGNOSIS — W1839XA Other fall on same level, initial encounter: Secondary | ICD-10-CM | POA: Insufficient documentation

## 2015-08-01 DIAGNOSIS — Y939 Activity, unspecified: Secondary | ICD-10-CM | POA: Diagnosis not present

## 2015-08-01 DIAGNOSIS — IMO0002 Reserved for concepts with insufficient information to code with codable children: Secondary | ICD-10-CM

## 2015-08-01 DIAGNOSIS — Z79899 Other long term (current) drug therapy: Secondary | ICD-10-CM | POA: Diagnosis not present

## 2015-08-01 DIAGNOSIS — W19XXXA Unspecified fall, initial encounter: Secondary | ICD-10-CM

## 2015-08-01 NOTE — ED Provider Notes (Signed)
[facility] Emergency Department Provider Note        Time seen: ----------------------------------------- 3:28 PM on 08/01/2015 -----------------------------------------    I have reviewed the triage vital signs and the nursing notes.   HISTORY  Chief Complaint Fall    HPI Traci Vaughan is a 82 y.o. female who presents to the ER after an unwitnessed fall while standing to get above for possibly 30 minutes. She denies loss of consciousness. She hit her head and left elbow. Left elbow was wrapped in Kerlix. There was noted to be a large skin tear there. She also had a small skin tear to left ankle. She arrives alert and oriented, denies any pain at this time.   Past Medical History:  Diagnosis Date  . Cancer [facility]) 1984   ovarian /Dr Lottie Rater [facility]  . Macular degeneration   . Skin cancer   . Thyroid disease     Patient Active Problem List   Diagnosis Date Noted  . UTI (lower urinary tract infection) 06/27/2015  . Dizziness 06/27/2015    Past Surgical History:  Procedure Laterality Date  . COLONOSCOPY    . Orif Right    Lower leg  . [place] HYSTERECTOMY  1984    Allergies Levothyroxine sodium  Social History Social History  Substance Use Topics  . Smoking status: Never Smoker  . Smokeless tobacco: Never Used  . Alcohol use No    Review of Systems Constitutional: Negative for fever. Cardiovascular: Negative for chest pain. Respiratory: Negative for shortness of breath. Gastrointestinal: Negative for abdominal pain, vomiting and diarrhea. Genitourinary: Negative for dysuria. Musculoskeletal: Negative for back pain. Skin: Positive for left elbow skin tear Neurological: Negative for headaches, focal weakness or numbness.  10-point ROS otherwise negative.  ____________________________________________   PHYSICAL EXAM:  VITAL SIGNS: ED Triage Vitals  Enc Vitals Group     BP 08/01/15 1410 (!)  127/51     Pulse Rate 08/01/15 1410 83     Resp 08/01/15 1410 20     Temp 08/01/15 1410 97.8 F (36.6 C)     Temp Source 08/01/15 1410 Oral     SpO2 08/01/15 1410 98 %     Weight 08/01/15 1410 120 lb 2.4 oz (54.5 kg)     Height 08/01/15 1410 5\' 4"  (1.626 m)     Head Circumference --      Peak Flow --      Pain Score 08/01/15 1413 0     Pain Loc --      Pain Edu? --      Excl. in [place]? --     Constitutional: Alert and oriented. Well appearing and in no distress. Eyes: Conjunctivae are normal. PERRL. Normal extraocular movements. ENT   Head: Normocephalic and atraumatic.   Nose: No congestion/rhinnorhea.   Mouth/Throat: Mucous membranes are moist.   Neck: No stridor. Cardiovascular: Normal rate, regular rhythm. No murmurs, rubs, or gallops. Respiratory: Normal respiratory effort without tachypnea nor retractions. Breath sounds are clear and equal bilaterally. No wheezes/rales/rhonchi. Gastrointestinal: Soft and nontender. Normal bowel sounds Musculoskeletal: Nontender with normal range of motion in all extremities. Mild left forearm tenderness around the laceration site Neurologic:  Normal speech and language. No gross focal neurologic deficits are appreciated.  Skin:  10 cm skin tear noted over the proximal, posterior medial forearm. Smaller skin tears noted around the elbow. Small skin tear noted around the left ankle laterally Psychiatric: Mood and  affect are normal. Speech and behavior are normal.   EKG: Interpreted by me, sinus rhythm with a rate of 83 bpm, normal PR interval, normal QRS, normal QT interval. Normal axis. ____________________________________________  ED COURSE:  Pertinent labs & imaging results that were available during my care of the patient were reviewed by me and considered in my medical decision making (see chart for details). Clinical Course    .Marland KitchenLaceration Repair Date/Time: 08/01/2015 3:30 PM Performed by: Earleen Newport Authorized  by: Lenise Arena E   Consent:    Consent obtained:  Verbal   Consent given by:  Patient Anesthesia (see MAR for exact dosages):    Anesthesia method:  None Laceration details:    Location:  Shoulder/arm   Shoulder/arm location:  L elbow   Length (cm):  10   Depth (mm):  0.5 Pre-procedure details:    Preparation:  Patient was prepped and draped in usual sterile fashion Exploration:    Hemostasis achieved with:  Direct pressure   Contaminated: no   Treatment:    Area cleansed with:  Saline   Amount of cleaning:  Standard Skin repair:    Repair method:  Tissue adhesive Approximation:    Approximation:  Close   Vermilion border: well-aligned   Post-procedure details:    Dressing:  Sterile dressing   Patient tolerance of procedure:  Tolerated well, no immediate complications   ____________________________________________   RADIOLOGY Images were viewed by me  CT head is unremarkable  ____________________________________________  FINAL ASSESSMENT AND PLAN  Fall, head injury, 10 cm superficial laceration  Plan: Patient with imaging as dictated above. Patient is in no acute distress, left elbow wound reapproximated with good cosmetic result. She is stable for outpatient follow-up.   Earleen Newport, MD   Note: This dictation was prepared with Dragon dictation. Any transcriptional errors that result from this process are unintentional    Earleen Newport, MD 08/01/15 [id number]

## 2015-08-01 NOTE — ED Notes (Signed)

## 2015-08-01 NOTE — ED Notes (Signed)
Report called to Doctor, general practice at [facility]

## 2015-08-01 NOTE — ED Triage Notes (Addendum)
Pt presents to ED via EMS c/o unwitnessed fall while standing to get book possibly for 30 minutes; denies LOC. Hit her head and left elbow. Left elbow wrapped in kerlex; skin tear noted on left ankle. Pt alert and Ox3.

## 2015-08-06 DIAGNOSIS — E039 Hypothyroidism, unspecified: Secondary | ICD-10-CM | POA: Diagnosis not present

## 2015-08-06 DIAGNOSIS — I10 Essential (primary) hypertension: Secondary | ICD-10-CM

## 2015-08-09 ENCOUNTER — Telehealth: Payer: Self-pay

## 2015-08-09 DIAGNOSIS — F29 Unspecified psychosis not due to a substance or known physiological condition: Secondary | ICD-10-CM | POA: Diagnosis not present

## 2015-08-09 NOTE — Telephone Encounter (Signed)
I know that I already started her on amoxicillin

## 2015-08-09 NOTE — Telephone Encounter (Signed)
Gara Kroner RN at [facility] left v/m; final report urine culture; C&S showing intracoccus species; sensitivity to ampicillin.levofloxacin, vancomycin and tetracycline. Butch Penny request cb.

## 2015-09-03 DIAGNOSIS — M7989 Other specified soft tissue disorders: Secondary | ICD-10-CM | POA: Diagnosis not present

## 2015-09-09 DIAGNOSIS — Z8543 Personal history of malignant neoplasm of ovary: Secondary | ICD-10-CM

## 2015-09-09 DIAGNOSIS — E039 Hypothyroidism, unspecified: Secondary | ICD-10-CM

## 2015-09-09 DIAGNOSIS — F0150 Vascular dementia without behavioral disturbance: Secondary | ICD-10-CM

## 2015-09-09 DIAGNOSIS — H3530 Unspecified macular degeneration: Secondary | ICD-10-CM | POA: Diagnosis not present

## 2015-09-09 DIAGNOSIS — I10 Essential (primary) hypertension: Secondary | ICD-10-CM | POA: Diagnosis not present

## 2015-09-20 DIAGNOSIS — S0083XA Contusion of other part of head, initial encounter: Secondary | ICD-10-CM | POA: Diagnosis not present

## 2015-09-20 DIAGNOSIS — W19XXXA Unspecified fall, initial encounter: Secondary | ICD-10-CM | POA: Diagnosis not present

## 2015-10-11 ENCOUNTER — Encounter
Admission: EM | Disposition: A | Discharge status: Skilled Nursing Facility | Payer: Self-pay | Source: Home / Self Care | Attending: Internal Medicine

## 2015-10-11 ENCOUNTER — Inpatient Hospital Stay: Payer: Medicare Other | Admitting: Anesthesiology

## 2015-10-11 ENCOUNTER — Emergency Department: Payer: Medicare Other

## 2015-10-11 ENCOUNTER — Encounter: Payer: Self-pay | Admitting: Emergency Medicine

## 2015-10-11 ENCOUNTER — Inpatient Hospital Stay
Admission: EM | Admit: 2015-10-11 | Discharge: 2015-10-14 | DRG: 482 | Disposition: A | Discharge status: Skilled Nursing Facility | Payer: Medicare Other | Attending: Internal Medicine | Admitting: Internal Medicine

## 2015-10-11 ENCOUNTER — Inpatient Hospital Stay: Payer: Medicare Other

## 2015-10-11 ENCOUNTER — Telehealth: Payer: Self-pay

## 2015-10-11 DIAGNOSIS — Z8543 Personal history of malignant neoplasm of ovary: Secondary | ICD-10-CM

## 2015-10-11 DIAGNOSIS — Z820 Family history of epilepsy and other diseases of the nervous system: Secondary | ICD-10-CM | POA: Diagnosis not present

## 2015-10-11 DIAGNOSIS — Z7982 Long term (current) use of aspirin: Secondary | ICD-10-CM

## 2015-10-11 DIAGNOSIS — E039 Hypothyroidism, unspecified: Secondary | ICD-10-CM | POA: Diagnosis present

## 2015-10-11 DIAGNOSIS — N9989 Other postprocedural complications and disorders of genitourinary system: Secondary | ICD-10-CM | POA: Diagnosis present

## 2015-10-11 DIAGNOSIS — I10 Essential (primary) hypertension: Secondary | ICD-10-CM | POA: Diagnosis present

## 2015-10-11 DIAGNOSIS — Z9049 Acquired absence of other specified parts of digestive tract: Secondary | ICD-10-CM | POA: Diagnosis not present

## 2015-10-11 DIAGNOSIS — Z85828 Personal history of other malignant neoplasm of skin: Secondary | ICD-10-CM

## 2015-10-11 DIAGNOSIS — R338 Other retention of urine: Secondary | ICD-10-CM | POA: Diagnosis present

## 2015-10-11 DIAGNOSIS — Z419 Encounter for procedure for purposes other than remedying health state, unspecified: Secondary | ICD-10-CM

## 2015-10-11 DIAGNOSIS — Z9889 Other specified postprocedural states: Secondary | ICD-10-CM | POA: Diagnosis not present

## 2015-10-11 DIAGNOSIS — Z79899 Other long term (current) drug therapy: Secondary | ICD-10-CM | POA: Diagnosis not present

## 2015-10-11 DIAGNOSIS — R296 Repeated falls: Secondary | ICD-10-CM | POA: Diagnosis present

## 2015-10-11 DIAGNOSIS — Z8249 Family history of ischemic heart disease and other diseases of the circulatory system: Secondary | ICD-10-CM | POA: Diagnosis not present

## 2015-10-11 DIAGNOSIS — M6281 Muscle weakness (generalized): Secondary | ICD-10-CM

## 2015-10-11 DIAGNOSIS — H3530 Unspecified macular degeneration: Secondary | ICD-10-CM | POA: Diagnosis present

## 2015-10-11 DIAGNOSIS — Z66 Do not resuscitate: Secondary | ICD-10-CM | POA: Diagnosis present

## 2015-10-11 DIAGNOSIS — R262 Difficulty in walking, not elsewhere classified: Secondary | ICD-10-CM

## 2015-10-11 DIAGNOSIS — Y92122 Bedroom in nursing home as the place of occurrence of the external cause: Secondary | ICD-10-CM | POA: Diagnosis not present

## 2015-10-11 DIAGNOSIS — Z90710 Acquired absence of both cervix and uterus: Secondary | ICD-10-CM

## 2015-10-11 DIAGNOSIS — Z9181 History of falling: Secondary | ICD-10-CM

## 2015-10-11 DIAGNOSIS — S72009A Fracture of unspecified part of neck of unspecified femur, initial encounter for closed fracture: Secondary | ICD-10-CM | POA: Diagnosis present

## 2015-10-11 DIAGNOSIS — W010XXA Fall on same level from slipping, tripping and stumbling without subsequent striking against object, initial encounter: Secondary | ICD-10-CM | POA: Diagnosis present

## 2015-10-11 DIAGNOSIS — D72829 Elevated white blood cell count, unspecified: Secondary | ICD-10-CM | POA: Diagnosis present

## 2015-10-11 DIAGNOSIS — W19XXXA Unspecified fall, initial encounter: Secondary | ICD-10-CM

## 2015-10-11 DIAGNOSIS — S72002A Fracture of unspecified part of neck of left femur, initial encounter for closed fracture: Secondary | ICD-10-CM | POA: Diagnosis present

## 2015-10-11 HISTORY — PX: HIP PINNING,CANNULATED: SHX1758

## 2015-10-11 LAB — CBC WITH DIFFERENTIAL/PLATELET
BASOS PCT: 0 %
Basophils Absolute: 0 10*3/uL (ref 0–0.1)
EOS ABS: 0.2 10*3/uL (ref 0–0.7)
EOS PCT: 2 %
HEMATOCRIT: 34.5 % — AB (ref 35.0–47.0)
Hemoglobin: 11.5 g/dL — ABNORMAL LOW (ref 12.0–16.0)
Lymphocytes Relative: 10 %
Lymphs Abs: 1.3 10*3/uL (ref 1.0–3.6)
MCH: 25.8 pg — ABNORMAL LOW (ref 26.0–34.0)
MCHC: 33.3 g/dL (ref 32.0–36.0)
MCV: 77.3 fL — ABNORMAL LOW (ref 80.0–100.0)
MONO ABS: 0.8 10*3/uL (ref 0.2–0.9)
MONOS PCT: 6 %
NEUTROS ABS: 10.8 10*3/uL — AB (ref 1.4–6.5)
Neutrophils Relative %: 82 %
PLATELETS: 255 10*3/uL (ref 150–440)
RBC: 4.47 MIL/uL (ref 3.80–5.20)
RDW: 19 % — AB (ref 11.5–14.5)
WBC: 13 10*3/uL — ABNORMAL HIGH (ref 3.6–11.0)

## 2015-10-11 LAB — SURGICAL PCR SCREEN
MRSA, PCR: POSITIVE — AB
Staphylococcus aureus: POSITIVE — AB

## 2015-10-11 LAB — COMPREHENSIVE METABOLIC PANEL
ALBUMIN: 3.4 g/dL — AB (ref 3.5–5.0)
ALT: 14 U/L (ref 14–54)
ANION GAP: 9 (ref 5–15)
AST: 18 U/L (ref 15–41)
Alkaline Phosphatase: 82 U/L (ref 38–126)
BILIRUBIN TOTAL: 0.5 mg/dL (ref 0.3–1.2)
BUN: 32 mg/dL — ABNORMAL HIGH (ref 6–20)
CHLORIDE: 106 mmol/L (ref 101–111)
CO2: 24 mmol/L (ref 22–32)
Calcium: 9 mg/dL (ref 8.9–10.3)
Creatinine, Ser: 1.07 mg/dL — ABNORMAL HIGH (ref 0.44–1.00)
GFR calc Af Amer: 50 mL/min — ABNORMAL LOW (ref >60)
GFR, EST NON AFRICAN AMERICAN: 43 mL/min — AB (ref >60)
GLUCOSE: 97 mg/dL (ref 65–99)
POTASSIUM: 4 mmol/L (ref 3.5–5.1)
Sodium: 139 mmol/L (ref 135–145)
TOTAL PROTEIN: 6.9 g/dL (ref 6.5–8.1)

## 2015-10-11 LAB — PROTIME-INR
INR: 1.04
PROTHROMBIN TIME: 13.6 s (ref 11.4–15.2)

## 2015-10-11 SURGERY — FIXATION, FEMUR, NECK, PERCUTANEOUS, USING SCREW
Anesthesia: Spinal | Laterality: Left

## 2015-10-11 MED ORDER — SODIUM CHLORIDE 0.9 % IV SOLN
INTRAVENOUS | Status: DC | PRN
  Administered 2015-10-11: 1000 mg via INTRAVENOUS

## 2015-10-11 MED ORDER — VANCOMYCIN HCL 1000 MG IV SOLR: 1000.0000 mg | INTRAVENOUS | Status: DC

## 2015-10-11 MED ORDER — TIMOLOL MALEATE 0.5 % OP SOLN
1.0000 [drp] | Freq: Two times a day (BID) | OPHTHALMIC | Status: DC
  Administered 2015-10-11 – 2015-10-14 (×6): 1 [drp] via OPHTHALMIC
  Filled 2015-10-11: qty 5

## 2015-10-11 MED ORDER — MAGNESIUM HYDROXIDE 400 MG/5ML PO SUSP
30.0000 mL | Freq: Every day | ORAL | Status: DC | PRN
  Administered 2015-10-13: 30 mL via ORAL
  Filled 2015-10-11: qty 30

## 2015-10-11 MED ORDER — BUPIVACAINE-EPINEPHRINE (PF) 0.5% -1:200000 IJ SOLN
INTRAMUSCULAR | Status: DC | PRN
  Administered 2015-10-11: 20 mL

## 2015-10-11 MED ORDER — ACETAMINOPHEN 650 MG RE SUPP: 650.0000 mg | Freq: Four times a day (QID) | RECTAL | Status: DC | PRN

## 2015-10-11 MED ORDER — TRAMADOL HCL 50 MG PO TABS
50.0000 mg | ORAL_TABLET | Freq: Four times a day (QID) | ORAL | Status: DC | PRN
  Administered 2015-10-12 (×2): 50 mg via ORAL
  Filled 2015-10-11 (×2): qty 1

## 2015-10-11 MED ORDER — BISACODYL 10 MG RE SUPP: 10.0000 mg | Freq: Every day | RECTAL | Status: DC | PRN

## 2015-10-11 MED ORDER — MORPHINE SULFATE (PF) 2 MG/ML IV SOLN: 2.0000 mg | INTRAVENOUS | Status: DC | PRN

## 2015-10-11 MED ORDER — SODIUM CHLORIDE 0.9 % IV SOLN
INTRAVENOUS | Status: DC
  Administered 2015-10-11: 12:00:00 via INTRAVENOUS

## 2015-10-11 MED ORDER — FELODIPINE ER 5 MG PO TB24
5.0000 mg | ORAL_TABLET | Freq: Every day | ORAL | Status: DC
  Administered 2015-10-12 – 2015-10-14 (×3): 5 mg via ORAL
  Filled 2015-10-11 (×4): qty 1

## 2015-10-11 MED ORDER — NEOMYCIN-POLYMYXIN B GU 40-200000 IR SOLN
Status: DC | PRN
  Administered 2015-10-11: 2 mL

## 2015-10-11 MED ORDER — NEOMYCIN-POLYMYXIN B GU 40-200000 IR SOLN
Status: AC
  Filled 2015-10-11: qty 2

## 2015-10-11 MED ORDER — FLEET ENEMA 7-19 GM/118ML RE ENEM: 1.0000 | ENEMA | Freq: Once | RECTAL | Status: DC | PRN

## 2015-10-11 MED ORDER — METOCLOPRAMIDE HCL 5 MG/ML IJ SOLN: 5.0000 mg | Freq: Three times a day (TID) | INTRAMUSCULAR | Status: DC | PRN

## 2015-10-11 MED ORDER — SODIUM CHLORIDE 0.9 % IV BOLUS (SEPSIS)
500.0000 mL | Freq: Once | INTRAVENOUS | Status: AC
  Administered 2015-10-11: 500 mL via INTRAVENOUS

## 2015-10-11 MED ORDER — KCL IN DEXTROSE-NACL 20-5-0.9 MEQ/L-%-% IV SOLN
INTRAVENOUS | Status: DC
  Administered 2015-10-11: 20:00:00 via INTRAVENOUS
  Filled 2015-10-11 (×5): qty 1000

## 2015-10-11 MED ORDER — DOCUSATE SODIUM 100 MG PO CAPS
100.0000 mg | ORAL_CAPSULE | Freq: Two times a day (BID) | ORAL | Status: DC
  Administered 2015-10-11 – 2015-10-14 (×5): 100 mg via ORAL
  Filled 2015-10-11 (×5): qty 1

## 2015-10-11 MED ORDER — PROPOFOL 500 MG/50ML IV EMUL
INTRAVENOUS | Status: DC | PRN
  Administered 2015-10-11: 20 ug/kg/min via INTRAVENOUS

## 2015-10-11 MED ORDER — FENTANYL CITRATE (PF) 100 MCG/2ML IJ SOLN: 25.0000 ug | INTRAMUSCULAR | Status: DC | PRN

## 2015-10-11 MED ORDER — ACETAMINOPHEN 325 MG PO TABS: 650.0000 mg | ORAL_TABLET | Freq: Four times a day (QID) | ORAL | Status: DC | PRN

## 2015-10-11 MED ORDER — ARTIFICIAL TEARS OP OINT
1.0000 "application " | TOPICAL_OINTMENT | Freq: Four times a day (QID) | OPHTHALMIC | Status: DC
  Administered 2015-10-11 – 2015-10-12 (×2): 1 via OPHTHALMIC
  Filled 2015-10-11: qty 3.5

## 2015-10-11 MED ORDER — ONDANSETRON HCL 4 MG/2ML IJ SOLN: 4.0000 mg | Freq: Once | INTRAMUSCULAR | Status: DC | PRN

## 2015-10-11 MED ORDER — ONDANSETRON HCL 4 MG/2ML IJ SOLN: 4.0000 mg | Freq: Four times a day (QID) | INTRAMUSCULAR | Status: DC | PRN

## 2015-10-11 MED ORDER — LEVOTHYROXINE SODIUM 75 MCG PO TABS
75.0000 ug | ORAL_TABLET | ORAL | Status: DC
  Filled 2015-10-11: qty 1

## 2015-10-11 MED ORDER — MORPHINE SULFATE (PF) 2 MG/ML IV SOLN
2.0000 mg | Freq: Once | INTRAVENOUS | Status: AC
Start: 2015-10-11 — End: 2015-10-11
  Administered 2015-10-11: 2 mg via INTRAVENOUS
  Filled 2015-10-11: qty 1

## 2015-10-11 MED ORDER — POLYETHYLENE GLYCOL 3350 17 G PO PACK
17.0000 g | PACK | Freq: Every day | ORAL | Status: DC
  Administered 2015-10-11 – 2015-10-13 (×3): 17 g via ORAL
  Filled 2015-10-11 (×3): qty 1

## 2015-10-11 MED ORDER — ACETAMINOPHEN 325 MG PO TABS
650.0000 mg | ORAL_TABLET | Freq: Four times a day (QID) | ORAL | Status: DC | PRN
  Administered 2015-10-13 – 2015-10-14 (×2): 650 mg via ORAL
  Filled 2015-10-11 (×3): qty 2

## 2015-10-11 MED ORDER — VITAMIN B-12 1000 MCG PO TABS
1000.0000 ug | ORAL_TABLET | ORAL | Status: DC
Start: 2015-10-11 — End: 2015-10-14
  Administered 2015-10-12 – 2015-10-14 (×3): 1000 ug via ORAL
  Filled 2015-10-11 (×4): qty 1

## 2015-10-11 MED ORDER — BUPIVACAINE-EPINEPHRINE (PF) 0.5% -1:200000 IJ SOLN
INTRAMUSCULAR | Status: AC
  Filled 2015-10-11: qty 30

## 2015-10-11 MED ORDER — METOCLOPRAMIDE HCL 10 MG PO TABS: 5.0000 mg | ORAL_TABLET | Freq: Three times a day (TID) | ORAL | Status: DC | PRN

## 2015-10-11 MED ORDER — VITAMIN D 1000 UNITS PO TABS
1000.0000 [IU] | ORAL_TABLET | Freq: Every day | ORAL | Status: DC
  Administered 2015-10-12 – 2015-10-14 (×3): 1000 [IU] via ORAL
  Filled 2015-10-11 (×4): qty 1

## 2015-10-11 MED ORDER — HYDROCODONE-ACETAMINOPHEN 5-325 MG PO TABS: 1.0000 | ORAL_TABLET | ORAL | Status: DC | PRN

## 2015-10-11 MED ORDER — HYDROMORPHONE HCL 1 MG/ML IJ SOLN: 0.5000 mg | INTRAMUSCULAR | Status: DC | PRN

## 2015-10-11 MED ORDER — DOCUSATE SODIUM 100 MG PO CAPS: 100.0000 mg | ORAL_CAPSULE | Freq: Two times a day (BID) | ORAL | Status: DC

## 2015-10-11 MED ORDER — ONDANSETRON HCL 4 MG PO TABS: 4.0000 mg | ORAL_TABLET | Freq: Four times a day (QID) | ORAL | Status: DC | PRN

## 2015-10-11 MED ORDER — ACETAMINOPHEN 500 MG PO TABS
1000.0000 mg | ORAL_TABLET | Freq: Four times a day (QID) | ORAL | Status: AC
  Administered 2015-10-11 – 2015-10-12 (×2): 1000 mg via ORAL
  Filled 2015-10-11 (×2): qty 2

## 2015-10-11 MED ORDER — PROPOFOL 10 MG/ML IV BOLUS
INTRAVENOUS | Status: DC | PRN
  Administered 2015-10-11: 40 mg via INTRAVENOUS

## 2015-10-11 MED ORDER — PANTOPRAZOLE SODIUM 40 MG PO TBEC
40.0000 mg | DELAYED_RELEASE_TABLET | Freq: Every day | ORAL | Status: DC
  Administered 2015-10-11 – 2015-10-14 (×4): 40 mg via ORAL
  Filled 2015-10-11 (×4): qty 1

## 2015-10-11 MED ORDER — VANCOMYCIN HCL IN DEXTROSE 1-5 GM/200ML-% IV SOLN
1000.0000 mg | Freq: Two times a day (BID) | INTRAVENOUS | Status: AC
  Administered 2015-10-12: 1000 mg via INTRAVENOUS
  Filled 2015-10-11: qty 200

## 2015-10-11 MED ORDER — BRIMONIDINE TARTRATE 0.15 % OP SOLN
1.0000 [drp] | Freq: Two times a day (BID) | OPHTHALMIC | Status: DC
  Administered 2015-10-11 – 2015-10-13 (×5): 1 [drp] via OPHTHALMIC
  Filled 2015-10-11: qty 5

## 2015-10-11 MED ORDER — SILVER SULFADIAZINE 1 % EX CREA
TOPICAL_CREAM | Freq: Every day | CUTANEOUS | Status: DC
  Administered 2015-10-12 – 2015-10-14 (×2): via TOPICAL
  Filled 2015-10-11: qty 85

## 2015-10-11 MED ORDER — LACTATED RINGERS IV SOLN
INTRAVENOUS | Status: DC | PRN
  Administered 2015-10-11: 16:00:00 via INTRAVENOUS

## 2015-10-11 MED ORDER — BIOTIN 1000 MCG PO TABS: 2500.0000 ug | ORAL_TABLET | ORAL | Status: DC

## 2015-10-11 MED ORDER — DIPHENHYDRAMINE HCL 12.5 MG/5ML PO ELIX: 12.5000 mg | ORAL_SOLUTION | ORAL | Status: DC | PRN

## 2015-10-11 MED ORDER — VANCOMYCIN HCL IN DEXTROSE 1-5 GM/200ML-% IV SOLN
1000.0000 mg | Freq: Once | INTRAVENOUS | Status: DC
  Filled 2015-10-11: qty 200

## 2015-10-11 MED ORDER — OXYCODONE HCL 5 MG PO TABS: 5.0000 mg | ORAL_TABLET | ORAL | Status: DC | PRN

## 2015-10-11 MED ORDER — VITAMIN E 180 MG (400 UNIT) PO CAPS: 400.0000 [IU] | ORAL_CAPSULE | Freq: Every day | ORAL | Status: DC

## 2015-10-11 MED ORDER — ENOXAPARIN SODIUM 30 MG/0.3ML ~~LOC~~ SOLN
30.0000 mg | SUBCUTANEOUS | Status: DC
  Administered 2015-10-12 – 2015-10-14 (×3): 30 mg via SUBCUTANEOUS
  Filled 2015-10-11 (×3): qty 0.3

## 2015-10-11 MED ORDER — FERROUS SULFATE 325 (65 FE) MG PO TABS
325.0000 mg | ORAL_TABLET | ORAL | Status: DC
  Administered 2015-10-12 – 2015-10-14 (×3): 325 mg via ORAL
  Filled 2015-10-11 (×3): qty 1

## 2015-10-11 MED ORDER — CEFAZOLIN SODIUM-DEXTROSE 2-4 GM/100ML-% IV SOLN
2.0000 g | INTRAVENOUS | Status: DC
  Filled 2015-10-11: qty 100

## 2015-10-11 SURGICAL SUPPLY — 30 items
BIT DRILL CANN LRG QC 5X300 (BIT) ×3 IMPLANT
BNDG COHESIVE 4X5 TAN STRL (GAUZE/BANDAGES/DRESSINGS) IMPLANT
BNDG COHESIVE 6X5 TAN STRL LF (GAUZE/BANDAGES/DRESSINGS) ×3 IMPLANT
CANISTER SUCT 1200ML W/VALVE (MISCELLANEOUS) ×3 IMPLANT
CHLORAPREP W/TINT 26ML (MISCELLANEOUS) ×3 IMPLANT
DRSG OPSITE POSTOP 3X4 (GAUZE/BANDAGES/DRESSINGS) ×3 IMPLANT
ELECT REM PT RETURN 9FT ADLT (ELECTROSURGICAL) ×3
ELECTRODE REM PT RTRN 9FT ADLT (ELECTROSURGICAL) ×1 IMPLANT
GLOVE BIO SURGEON STRL SZ8 (GLOVE) ×6 IMPLANT
GLOVE INDICATOR 8.0 STRL GRN (GLOVE) ×3 IMPLANT
GOWN STRL REUS W/ TWL LRG LVL3 (GOWN DISPOSABLE) ×1 IMPLANT
GOWN STRL REUS W/ TWL XL LVL3 (GOWN DISPOSABLE) ×1 IMPLANT
GOWN STRL REUS W/TWL LRG LVL3 (GOWN DISPOSABLE) ×2
GOWN STRL REUS W/TWL XL LVL3 (GOWN DISPOSABLE) ×2
GUIDEWIRE THREADED 2.8 (WIRE) ×9 IMPLANT
NEEDLE FILTER BLUNT 18X 1/2SAF (NEEDLE) ×2
NEEDLE FILTER BLUNT 18X1 1/2 (NEEDLE) ×1 IMPLANT
NEEDLE HYPO 22GX1.5 SAFETY (NEEDLE) ×3 IMPLANT
PACK HIP COMPR (MISCELLANEOUS) ×3 IMPLANT
SCREW CANN 6.5X75MM (Screw) ×6 IMPLANT
SCREW CANN 6.5X80MM (Screw) ×3 IMPLANT
STAPLER SKIN PROX 35W (STAPLE) ×3 IMPLANT
STRAP SAFETY BODY (MISCELLANEOUS) ×3 IMPLANT
SUT PROLENE 2 0 FS (SUTURE) IMPLANT
SUT VIC AB 0 CT1 36 (SUTURE) ×3 IMPLANT
SUT VIC AB 0 SH 27 (SUTURE) IMPLANT
SUT VIC AB 2-0 CT1 27 (SUTURE) ×2
SUT VIC AB 2-0 CT1 TAPERPNT 27 (SUTURE) ×1 IMPLANT
SYR 20CC LL (SYRINGE) ×3 IMPLANT
SYR 5ML LL (SYRINGE) ×3 IMPLANT

## 2015-10-11 NOTE — ED Notes (Signed)
Family updated on plan of care  

## 2015-10-11 NOTE — Anesthesia Preprocedure Evaluation (Addendum)
Anesthesia Evaluation  Patient identified by MRN, date of birth, ID band Patient awake and Patient confused    Reviewed: Allergy & Precautions, H&P , NPO status , Patient's Chart, lab work & pertinent test results  History of Anesthesia Complications Negative for: history of anesthetic complications  Airway Mallampati: II  TM Distance: >3 FB Neck ROM: full    Dental  (+) Teeth Intact   Pulmonary neg pulmonary ROS,    Pulmonary exam normal        Cardiovascular negative cardio ROS Normal cardiovascular exam Rhythm:regular Rate:Normal     Neuro/Psych Pt with varying "sundowners". Confused and not answering questions immediately pre-op. negative neurological ROS  negative psych ROS   GI/Hepatic negative GI ROS, Neg liver ROS,   Endo/Other  negative endocrine ROS  Renal/GU negative Renal ROS  negative genitourinary   Musculoskeletal   Abdominal   Peds  Hematology negative hematology ROS (+)   Anesthesia Other Findings Past Medical History: 1984: Cancer ([place])     Comment: ovarian /Dr Lottie Rater [facility] No date: Macular degeneration No date: Skin cancer No date: Thyroid disease Past Surgical History: No date: CHOLECYSTECTOMY No date: COLONOSCOPY No date: Orif Right     Comment: Lower leg 1984: OVARY SURGERY     Comment: [facility] 1984: VAGINAL HYSTERECTOMY BMI    Body Mass Index:  20.84 kg/m     Reproductive/Obstetrics negative OB ROS                            Anesthesia Physical Anesthesia Plan  ASA: II and emergent  Anesthesia Plan: Spinal   Post-op Pain Management:    Induction:   Airway Management Planned:   Additional Equipment:   Intra-op Plan:   Post-operative Plan:   Informed Consent: I have reviewed the patients History and Physical, chart, labs and discussed the procedure including the risks, benefits and alternatives for the proposed anesthesia with the patient or  authorized representative who has indicated his/her understanding and acceptance.   Dental Advisory Given  Plan Discussed with: CRNA  Anesthesia Plan Comments:         Anesthesia Quick Evaluation

## 2015-10-11 NOTE — ED Notes (Signed)
Report from [place], [place]. Care assumed by this RN.

## 2015-10-11 NOTE — ED Provider Notes (Addendum)
[facility] Emergency Department Provider Note  ____________________________________________   First MD Initiated Contact with Patient 10/11/15 [phone]     (approximate)  I have reviewed the triage vital signs and the nursing notes.   HISTORY  Chief Complaint Fall   HPI Traci Vaughan is a 82 y.o. female with a history of multiple falls who says that she was walking in the middle the night in her room when she had a slipped on a rug and fell backward hitting the back of her head. She denies losing consciousness. She denies any pain at this time. His that she has had multiple falls and has multiple bruises on her body from these falls including bandages to her bilateral upper extremities.   Past Medical History:  Diagnosis Date  . Cancer [facility]) 1984   ovarian /Dr Lottie Rater [facility]  . Macular degeneration   . Skin cancer   . Thyroid disease     Patient Active Problem List   Diagnosis Date Noted  . UTI (lower urinary tract infection) 06/27/2015  . Dizziness 06/27/2015    Past Surgical History:  Procedure Laterality Date  . COLONOSCOPY    . Orif Right    Lower leg  . [place]    Prior to Admission medications   Medication Sig Start Date End Date Taking? Authorizing Provider  aspirin 325 MG tablet Take 1 tablet (325 mg total) by mouth daily. 06/30/15   Bettey Costa, MD  atorvastatin (LIPITOR) 10 MG tablet Take 10 mg by mouth daily at 6 PM.  12/13/14   Historical Provider, MD  Biotin 1 MG CAPS Take 2.5 tablets by mouth daily.     Historical Provider, MD  brimonidine (ALPHAGAN) 0.15 % ophthalmic solution Place 1 drop into both eyes 2 (two) times daily. Reported on 06/26/2015 11/04/14   Historical Provider, MD  Cholecalciferol (VITAMIN D-3 PO) Take 1,000 Units by mouth daily.     Historical Provider, MD  Cyanocobalamin (VITAMIN B 12 PO) Take 2,500 mcg by mouth daily.     Historical Provider, MD  felodipine  (PLENDIL) 5 MG 24 hr tablet Take 5 mg by mouth daily.  01/11/15   Historical Provider, MD  HYDROcodone-acetaminophen (NORCO/VICODIN) 5-325 MG tablet Take 1 tablet by mouth every 4 (four) hours as needed for moderate pain or severe pain. 06/30/15   Bettey Costa, MD  Multiple Vitamins-Minerals (EYE VITAMINS PO) Take by mouth 2 (two) times daily. Reported on 06/26/2015    Historical Provider, MD  Multiple Vitamins-Minerals (ZINC PO) Take 50 mg by mouth daily.     Historical Provider, MD  polyethylene glycol (MIRALAX / GLYCOLAX) packet Take 17 g by mouth daily.    Historical Provider, MD  PREMARIN 0.625 MG tablet Take 0.625 mg by mouth daily.  01/01/15   Historical Provider, MD  SYNTHROID 75 MCG tablet Take 75 mcg by mouth every other day. Mondays, Wednesdays, and Fridays. 11/04/14   Historical Provider, MD  timolol (TIMOPTIC) 0.5 % ophthalmic solution Place 1 drop into both eyes 2 (two) times daily.  12/01/14   Historical Provider, MD  triamterene-hydrochlorothiazide (MAXZIDE-25) 37.5-25 MG tablet Take 1 tablet by mouth daily.  12/28/14   Historical Provider, MD  vitamin E 400 UNIT capsule Take 400 Units by mouth daily.    Historical Provider, MD    Allergies Levothyroxine sodium  Family History  Problem Relation Age of Onset  . Rheum arthritis Neg Hx   .  Osteoarthritis Neg Hx   . Asthma Neg Hx   . Cancer Neg Hx   . Diabetes Neg Hx   . Heart failure Neg Hx     Social History Social History  Substance Use Topics  . Smoking status: Never Smoker  . Smokeless tobacco: Never Used  . Alcohol use No    Review of Systems Constitutional: No fever/chills Eyes: No visual changes. ENT: No sore throat. Cardiovascular: Denies chest pain. Respiratory: Denies shortness of breath. Gastrointestinal: No abdominal pain.  No nausea, no vomiting.  No diarrhea.  No constipation. Genitourinary: Negative for dysuria. Musculoskeletal: Negative for back pain. Skin: Negative for rash. Neurological: Negative for  headaches, focal weakness or numbness.  10-point ROS otherwise negative.  ____________________________________________   PHYSICAL EXAM:  VITAL SIGNS: ED Triage Vitals  Enc Vitals Group     BP 10/11/15 0509 (!) 148/77     Pulse Rate 10/11/15 0509 67     Resp 10/11/15 0509 18     Temp 10/11/15 0509 97.1 F (36.2 C)     Temp Source 10/11/15 0509 Oral     SpO2 10/11/15 0509 96 %     Weight 10/11/15 0509 121 lb 6.4 oz (55.1 kg)     Height 10/11/15 0509 5\' 4"  (1.626 m)     Head Circumference --      Peak Flow --      Pain Score 10/11/15 0510 0     Pain Loc --      Pain Edu? --      Excl. in [place]? --     Constitutional: Alert and oriented. Well appearing and in no acute distress. Eyes: Conjunctivae are normal. PERRL. EOMI. Head: Atraumatic. Nose: No congestion/rhinnorhea. Mouth/Throat: Mucous membranes are moist.   Neck: No stridor.  No midline tenderness to the cervical spine. No step-off nor deformity. Cardiovascular: Normal rate, regular rhythm. Grossly normal heart sounds.   Respiratory: Normal respiratory effort.  No retractions. Lungs CTAB. Gastrointestinal: Soft and nontender. No distention. No CVA tenderness. Musculoskeletal: No lower extremity tenderness nor edema.  No joint effusions.  Pelvis is stable without any tenderness to the bilateral hips. Able to flex at the hips with 5 out of 5 strength bilaterally. Neurologic:  Normal speech and language. No gross focal neurologic deficits are appreciated. No gait instability. Skin:  Skin is warm, dry.  Well healing abrasion to the distal right arm without any induration, pus or bleeding. Left upper extremity healing laceration over the olecranon without any visible dehiscence, active bleeding or pus. Full range of motion of the left elbow as well. Psychiatric: Mood and affect are normal. Speech and behavior are normal.  ____________________________________________   LABS (all labs ordered are listed, but only abnormal  results are displayed)  Labs Reviewed - No data to display ____________________________________________  EKG   ____________________________________________  RADIOLOGY  CT Head Wo Contrast (Accession [id number]) (Order [id number])  Imaging  Date: 10/11/2015 Department: [facility] EMERGENCY DEPARTMENT Released By/Authorizing: Orbie Pyo, MD (auto-released)  Exam Information   Status Exam Begun  Exam Ended   Final [99] 10/11/2015 6:17 AM 10/11/2015 6:28 AM  PACS Images   Show images for CT Head Wo Contrast  Study Result   CLINICAL DATA:  Status post fall.  EXAM: CT HEAD WITHOUT CONTRAST  TECHNIQUE: Contiguous axial images were obtained from the base of the skull through the vertex without intravenous contrast.  COMPARISON:  Head CT 08/01/2015  FINDINGS: Brain: No mass lesion, intraparenchymal hemorrhage or  extra-axial collection. No evidence of acute cortical infarct. There is periventricular hypoattenuation compatible with chronic microvascular disease. There is moderate atrophy.  Vascular: There is mild vertebrobasilar calcification.  Skull: Normal visualized skull base, calvarium and extracranial soft tissues.  Sinuses/Orbits: No sinus fluid levels or advanced mucosal thickening. No mastoid effusion. Normal orbits.  IMPRESSION: 1. No acute intracranial abnormality. 2. Chronic microvascular ischemia and moderate cerebral atrophy.   Electronically Signed   By: Ulyses Jarred M.D.   On: 10/11/2015 06:37     ____________________________________________   PROCEDURES  Procedure(s) performed:   Procedures  Critical Care performed:   ____________________________________________   INITIAL IMPRESSION / ASSESSMENT AND PLAN / ED COURSE  Pertinent labs & imaging results that were available during my care of the patient were reviewed by me and considered in my medical decision making (see chart for  details).  ----------------------------------------- 6:48 AM on 10/11/2015 -----------------------------------------  Reassessed the patient was resting comfortably at this time without any complaints of pain. I also discussed the findings with her son, Dellamae Nuchols. Mr. Lagrassa says that the patient has been at [facility] [place] for several months now and has been experiencing sundowners and will get up in the middle of the night inappropriately and has had multiple falls because of this. We discussed her reassuring exam as well as imaging. She'll be discharged with Mr. South who will be transporting her Bextra and legs.  Clinical Course     ____________________________________________   FINAL CLINICAL IMPRESSION(S) / ED DIAGNOSES  Mechanical fall.    NEW MEDICATIONS STARTED DURING THIS VISIT:  New Prescriptions   No medications on file     Note:  This document was prepared using Dragon voice recognition software and may include unintentional dictation errors.    Orbie Pyo, MD 10/11/15 [phone]  Pt's son here to pick her up.  Pt now complaining of left hip pain.  Will xray.  Signed out to Dr. Marcelene Butte.      Orbie Pyo, MD 10/11/15 [phone]

## 2015-10-11 NOTE — ED Notes (Signed)
Son Traci Vaughan is at bedside, made aware of plan of care. No needs expressed by patient or family.

## 2015-10-11 NOTE — ED Notes (Signed)
Report given to OR. Will take patient to OR at approx 1530. No orders placed by Dr. Roland Rack, who will be surgeon. Dr. Roland Rack has not seen patient at this time. No consents signed.

## 2015-10-11 NOTE — NC FL2 (Signed)
[place] LEVEL OF CARE SCREENING TOOL     IDENTIFICATION  Patient Name: Traci Vaughan Birthdate: 07-Jun-1918 Sex: female Admission Date (Current Location): 10/11/2015  [place] and [place] Number:  Engineering geologist and Address:  [facility], [address] [place], [place] [address]      Provider Number: [id number]  Attending Physician Name and Address:  Gladstone Lighter, MD  Relative Name and Phone Number:       Current Level of Care: Hospital Recommended Level of Care: [place] Prior Approval Number:    Date Approved/Denied:   PASRR Number:  ( [id number] )  Discharge Plan: SNF    Current Diagnoses: Patient Active Problem List   Diagnosis Date Noted  . Hip fracture ([place]) 10/11/2015  . UTI (lower urinary tract infection) 06/27/2015  . Dizziness 06/27/2015    Orientation RESPIRATION BLADDER Height & Weight     Self, Time, Situation, Place  Normal Continent Weight: 121 lb 6.4 oz (55.1 kg) Height:  5\' 4"  (162.6 cm)  BEHAVIORAL SYMPTOMS/MOOD NEUROLOGICAL BOWEL NUTRITION STATUS   (none)  (none ) Continent Diet (NPO for surgery )  AMBULATORY STATUS COMMUNICATION OF NEEDS Skin   Extensive Assist Verbally Surgical wounds                       Personal Care Assistance Level of Assistance  Bathing, Feeding, Dressing Bathing Assistance: Limited assistance Feeding assistance: Limited assistance Dressing Assistance: Limited assistance     Functional Limitations Info  Sight, Hearing, Speech Sight Info: Adequate Hearing Info: Adequate Speech Info: Adequate    SPECIAL CARE FACTORS FREQUENCY  PT (By licensed PT), OT (By licensed OT)     PT Frequency:  (5) OT Frequency:  (5)            Contractures      Additional Factors Info  Code Status, Allergies Code Status Info:  (Full Code. ) Allergies Info:  (Levothyroxine Sodium)           Current Medications (10/11/2015):  This is the current  hospital active medication list Current Facility-Administered Medications  Medication Dose Route Frequency Provider Last Rate Last Dose  . 0.9 %  sodium chloride infusion   Intravenous Continuous Gladstone Lighter, MD 75 mL/hr at 10/11/15 1229    . acetaminophen (TYLENOL) tablet 650 mg  650 mg Oral Q6H PRN Gladstone Lighter, MD       Or  . acetaminophen (TYLENOL) suppository 650 mg  650 mg Rectal Q6H PRN Gladstone Lighter, MD      . artificial tears (LACRILUBE) ophthalmic ointment 1 application  1 application Both Eyes QID Gladstone Lighter, MD   1 application at [id number]  . brimonidine (ALPHAGAN) 0.15 % ophthalmic solution 1 drop  1 drop Both Eyes BID Gladstone Lighter, MD   1 drop at 10/11/15 1254  . ceFAZolin (ANCEF) IVPB 2g/100 mL premix  2 g Intravenous 30 min Pre-Op Corky Mull, MD      . cholecalciferol (VITAMIN D) tablet 1,000 Units  1,000 Units Oral Daily Gladstone Lighter, MD   Stopped at 10/11/15 1300  . docusate sodium (COLACE) capsule 100 mg  100 mg Oral BID Gladstone Lighter, MD      . felodipine (PLENDIL) 24 hr tablet 5 mg  5 mg Oral Daily Gladstone Lighter, MD      . ferrous sulfate tablet 325 mg  325 mg Oral [id number] Gladstone Lighter, MD      .  HYDROcodone-acetaminophen (NORCO/VICODIN) 5-325 MG per tablet 1-2 tablet  1-2 tablet Oral Q4H PRN Gladstone Lighter, MD      . levothyroxine (SYNTHROID, LEVOTHROID) tablet 75 mcg  75 mcg Oral Q M,W,F Gladstone Lighter, MD      . morphine 2 MG/ML injection 2 mg  2 mg Intravenous Q3H PRN Gladstone Lighter, MD      . ondansetron (ZOFRAN) tablet 4 mg  4 mg Oral Q6H PRN Gladstone Lighter, MD       Or  . ondansetron (ZOFRAN) injection 4 mg  4 mg Intravenous Q6H PRN Gladstone Lighter, MD      . polyethylene glycol (MIRALAX / GLYCOLAX) packet 17 g  17 g Oral QHS Gladstone Lighter, MD      . timolol (TIMOPTIC) 0.5 % ophthalmic solution 1 drop  1 drop Both Eyes BID Gladstone Lighter, MD   1 drop at 10/11/15 1254  . vitamin B-12  (CYANOCOBALAMIN) tablet 1,000 mcg  1,000 mcg Oral BH-q7a Gladstone Lighter, MD         Discharge Medications: Please see discharge summary for a list of discharge medications.  Relevant Imaging Results:  Relevant Lab Results:   Additional Information  (SSN: [id number])  Traci Vaughan, Traci Beets, LCSW

## 2015-10-11 NOTE — ED Notes (Signed)
Report to Roselyn Reef, RN on 1A.

## 2015-10-11 NOTE — ED Notes (Signed)
Pt had last food or drink 10/10/15 approx 6PM.

## 2015-10-11 NOTE — Consult Note (Signed)
ORTHOPAEDIC CONSULTATION  REQUESTING PHYSICIAN: Gladstone Lighter, MD  Chief Complaint:   Left hip pain.  History of Present Illness: Traci Vaughan is a 82 y.o. female who is an otherwise fairly good health and lives independently, but ambulate with a walker. Apparently she got out of bed to go the bathroom last evening when she lost her balance and fell backwards, injuring her left hip. She was brought to the emergency room where x-rays of her left hip and pelvis demonstrated a valgus impacted left femoral neck fracture. She also struck her head, but a CT scan in the emergency room was unremarkable for any acute pathology. The patient denies any loss of consciousness, nor does she note any dizziness or lightheadedness. She also denies any lightheadedness, dizziness, chest pain, or other symptoms that may have precipitated her fall. She denies any numbness or paresthesias down her leg. She does note that she has been falling more frequently.  Past Medical History:  Diagnosis Date  . Cancer [facility]) 1984   ovarian /Dr Lottie Rater [facility]  . Macular degeneration   . Skin cancer   . Thyroid disease    Past Surgical History:  Procedure Laterality Date  . CHOLECYSTECTOMY    . COLONOSCOPY    . Orif Right    Lower leg  . [place]   Social History   Social History  . Marital status: Widowed    Spouse name: N/A  . Number of children: N/A  . Years of education: N/A   Occupational History  . retired    Social History Main Topics  . Smoking status: Never Smoker  . Smokeless tobacco: Never Used  . Alcohol use No  . Drug use: No  . Sexual activity: Not Asked   Other Topics Concern  . None   Social History Narrative   From [facility] Has a walker, but does not use at home.   Family History  Problem Relation Age of Onset  . Dementia Mother   . CAD Father   .  Rheum arthritis Neg Hx   . Osteoarthritis Neg Hx   . Asthma Neg Hx   . Cancer Neg Hx   . Diabetes Neg Hx   . Heart failure Neg Hx    Allergies  Allergen Reactions  . Levothyroxine Sodium Other (See Comments)   Prior to Admission medications   Medication Sig Start Date End Date Taking? Authorizing Provider  aspirin 325 MG tablet Take 1 tablet (325 mg total) by mouth daily. 06/30/15  Yes Bettey Costa, MD  Biotin 1000 MCG tablet Take 2,500 mcg by mouth every morning.   Yes Historical Provider, MD  brimonidine (ALPHAGAN) 0.15 % ophthalmic solution Place 1 drop into both eyes 2 (two) times daily. Reported on 06/26/2015 11/04/14  Yes Historical Provider, MD  cholecalciferol (VITAMIN D) 1000 units tablet Take 1,000 Units by mouth daily.   Yes Historical Provider, MD  Cyanocobalamin (VITAMIN B-12) 2500 MCG SUBL Place 2,500 mcg under the tongue every morning.   Yes Historical Provider, MD  felodipine (PLENDIL) 5 MG 24 hr tablet Take 5 mg by mouth daily.  01/11/15  Yes Historical Provider, MD  ferrous sulfate 325 (65 FE) MG tablet Take 325 mg by mouth every morning.   Yes Historical Provider, MD  HYDROcodone-acetaminophen (NORCO/VICODIN) 5-325 MG tablet Take 1 tablet by mouth every 4 (four) hours as needed for moderate pain or severe pain. 06/30/15  Yes Bettey Costa, MD  hypromellose (GENTEAL) 0.3 % GEL ophthalmic ointment Place 1 application into both eyes 4 (four) times daily.   Yes Historical Provider, MD  levothyroxine (SYNTHROID, LEVOTHROID) 75 MCG tablet Take 75 mcg by mouth every Monday, Wednesday, and Friday.   Yes Historical Provider, MD  naproxen sodium (ANAPROX) 220 MG tablet Take 220 mg by mouth 2 (two) times daily as needed. For pain   Yes Historical Provider, MD  polyethylene glycol (MIRALAX / GLYCOLAX) packet Take 17 g by mouth at bedtime.    Yes Historical Provider, MD  timolol (TIMOPTIC-XR) 0.5 % ophthalmic gel-forming Place 1 drop into both eyes 2 (two) times daily.   Yes Historical Provider,  MD  vitamin E 400 UNIT capsule Take 400 Units by mouth daily with lunch.    Yes Historical Provider, MD  Zinc 50 MG TABS Take 50 mg by mouth every morning.   Yes Historical Provider, MD   Ct Head Wo Contrast  Result Date: 10/11/2015 CLINICAL DATA:  Status post fall. EXAM: CT HEAD WITHOUT CONTRAST TECHNIQUE: Contiguous axial images were obtained from the base of the skull through the vertex without intravenous contrast. COMPARISON:  Head CT 08/01/2015 FINDINGS: Brain: No mass lesion, intraparenchymal hemorrhage or extra-axial collection. No evidence of acute cortical infarct. There is periventricular hypoattenuation compatible with chronic microvascular disease. There is moderate atrophy. Vascular: There is mild vertebrobasilar calcification. Skull: Normal visualized skull base, calvarium and extracranial soft tissues. Sinuses/Orbits: No sinus fluid levels or advanced mucosal thickening. No mastoid effusion. Normal orbits. IMPRESSION: 1. No acute intracranial abnormality. 2. Chronic microvascular ischemia and moderate cerebral atrophy. Electronically Signed   By: Ulyses Jarred M.D.   On: 10/11/2015 06:37   Dg Chest Port 1 View  Result Date: 10/11/2015 CLINICAL DATA:  Status post fall.  Preop exam EXAM: PORTABLE CHEST 1 VIEW COMPARISON:  6/24/7 FINDINGS: Normal heart size. Aortic atherosclerosis noted. There are several nodular densities identified within the left upper lobe. These are indeterminate but not clearly identified on the previous radiograph. No airspace consolidation identified. No pleural effusion or edema. IMPRESSION: 1. No acute cardiopulmonary abnormalities. 2. Indeterminate nodular densities within the left upper lobe appear new from previous exam. Suggest followup imaging with nonemergent noncontrast CT of the chest. These results will be called to the ordering clinician or representative by the Radiologist Assistant, and communication documented in the PACS or zVision Dashboard.  Electronically Signed   By: Kerby Moors M.D.   On: 10/11/2015 09:43   Dg Hip Unilat W Or Wo Pelvis 2-3 Views Left  Result Date: 10/11/2015 CLINICAL DATA:  Fall last night onto left side.  Left hip pain. EXAM: DG HIP (WITH OR WITHOUT PELVIS) 2-3V LEFT COMPARISON:  Radiographs from 01/15/2015 FINDINGS: Acute left femoral neck fracture extending from the midportion of the femoral neck medially to the subcapital portion laterally. The left hip is internally rotated on the frontal projections. Mild spurring of the pubis. Numerous clips project over the anatomic pelvis and lower abdomen. Bony demineralization. IMPRESSION: 1. Acute left femoral neck fracture. Electronically Signed   By: Van Clines M.D.   On: 10/11/2015 08:10    Positive ROS: All other systems have been reviewed and were otherwise negative with the exception of those mentioned in the HPI and as above.  Physical Exam: General:  Alert, no acute distress Psychiatric:  Patient is competent for consent with normal mood and affect   Cardiovascular:  No pedal edema Respiratory:  No wheezing, non-labored breathing GI:  Abdomen is soft and non-tender Skin:  No lesions in the area of chief complaint Neurologic:  Sensation intact distally Lymphatic:  No axillary or cervical lymphadenopathy  Orthopedic Exam:  Orthopedic examination is limited to the left hip and lower extremity. Skin inspection around the left hip is unremarkable. There is no swelling, erythema, ecchymosis, or abrasions. However, she has multiple contusions and abrasions of varying ages around her left knee and lower leg, as well as abrasions over the right lower extremity and left upper extremity. She has mild tenderness to palpation over the lateral hip region. Her left lower extremity alignment is symmetric to the right. She has pain with any attempted active or passive motion of the left hip. She is neurovascular intact to the left lower extremity and foot.  X-rays:   Recent x-rays of the pelvis and left hip are available for review. These films demonstrate a stable appearing valgus impacted left femoral neck fracture. No significant degenerative changes of the hip joint are noted.  Assessment: Valgus impacted left femoral neck fracture.  Plan: The treatment options are discussed with the patient, including both surgical and nonsurgical management of this issue. The patient would like to proceed with surgical intervention to include the in situ cannulated screw fixation of the valgus impacted left femoral neck fracture. This procedure has been discussed in detail, as have the potential risks (including bleeding, infection, nerve and/or blood vessel injury, persistent or recurrent pain, stiffness, malunion and/or nonunion, need for further surgery, blood clots, strokes, heart attacks and/or arrhythmias, etc.) and benefits. The patient states her understanding and wishes to proceed. A formal written consent will be obtained by the nursing staff.  Thank you for asking me to participate in the care of this most pleasant woman. I will be happy to follow her with you.   Pascal Lux, MD  Beeper #:  [phone]  10/11/2015 1:15 PM

## 2015-10-11 NOTE — Progress Notes (Signed)
Lab notified this RN of a positive MRSA of the nares. Patient in surgery. Notified OR of results.

## 2015-10-11 NOTE — Op Note (Signed)
10/11/2015  5:15 PM  Patient:   Traci Vaughan  Pre-Op Diagnosis:   Valgus-impacted left femoral neck fracture.  Post-Op Diagnosis:   Same.  Procedure:   In situ cannulated screw fixation of valgus-impacted left femoral neck fracture.  Surgeon:   Pascal Lux, MD  Assistant:   None  Anesthesia:   Spinal  Findings:   As above.  Complications:   None  EBL:   30 cc  Fluids:   400 cc crystalloid  UOP:   425 cc  TT:   None  Drains:   None  Closure:   Staples  Implants:   Biomet 6.5 mm cannulated screws (16 mm) 3  Brief Clinical Note:   The patient is a 82 year old female who sustained the above-noted injury last evening when she apparently lost her balance and fell while getting up to go to the bathroom. The patient presented to the emergency room where x-rays demonstrated the above-noted findings. The patient has been cleared medically and presents at this time for definitive management of this injury.  Procedure:   The patient was brought into the operating room. After adequate spinal anesthesia was obtained, the patient was lain in the supine position on the fracture table. The uninjured leg was placed in a flexed and abducted position over the well-leg holder while the operative leg was placed in gentle longitudinal traction with some internal rotation. The adequacy of fracture position was verified fluoroscopically in AP and lateral projections before the lateral aspect of the left hip and thigh were prepped with ChloraPrep solution and draped sterilely. Preoperative antibiotics were administered. An approximately 3-4 cm incision was made over the lateral aspect of the lower part of the greater trochanter as verified fluoroscopically. This incision was carried down through the subcutaneous cutaneous tissues to expose the iliotibial band. This was split the length the incision to expose the lateral aspect of the proximal femur at the level of the inferior most part of the  greater trochanter. Under fluoroscopic guidance, a guide wire was drilled up through the femoral neck along the calcar into the femoral head to rest within 5 mm of subchondral bone. Its position was assessed fluoroscopically in AP and lateral projections and found to be excellent. Two additional guide wires were placed in parallel fashion more proximally, anterior and posterior to the original pin in an inverted triangular configuration. Again the position of these pins was verified fluoroscopically in AP, lateral, and oblique projections and found to be excellent. The length of each of these pins was measured before each pin was overdrilled with the appropriate cannulated drill. Each of these screws was inserted and advanced to within 8-10 mm of subchondral bone. Again the position of each of these screws was assessed fluoroscopically in AP, lateral, and oblique projections, and found to be excellent.  The wound was copiously irrigated with sterile saline solution before the IT band was reapproximated using #0 Vicryl interrupted sutures. The subcutaneous tissues also were closed using 2-0 Vicryl interrupted sutures before the skin was closed using staples. A sterile occlusive dressing was applied to the wound before the patient was transferred back to the hospital bed. The patient was then awakened and returned to the recovery room in satisfactory condition after tolerating the procedure well.

## 2015-10-11 NOTE — Clinical Social Work Note (Signed)
Clinical Social Work Assessment  Patient Details  Name: Traci Vaughan MRN: [id number] Date of Birth: [birth year]  Date of referral:  10/11/15               Reason for consult:  Other (Comment Required) (from [place] )                Permission sought to share information with:  Chartered certified accountant granted to share information::  Yes, Verbal Permission Granted  Name::      Chief Executive Officer::   [place]   Relationship::     Contact Information:     Housing/Transportation Living arrangements for the past 2 months:  [place] of Information:  Adult Children Patient Interpreter Needed:  None Criminal Activity/Legal Involvement Pertinent to Current Situation/Hospitalization:  No - Comment as needed Significant Relationships:  Adult Children Lives with:  Facility Resident Do you feel safe going back to the place where you live?  Yes Need for family participation in patient care:  Yes (Comment)  Care giving concerns:  Patient is a long term care resident at [facility].     Social Worker assessment / plan:  Holiday representative ([place]) reviewed chart and noted that patient is from [place]. Per Seth Bake admissions coordinator at [facility] patient is on the SNF side and can return when stable. CSW attempted to meet with patient however she was off the floor for surgery with Dr. Roland Rack. Patient was admitted to [facility] with a hip fracture. CSW contacted patient's son [name]. Per son patient has been at [facility] since June 2017 and before that lived in her own residence in [place] not in the independent living section at [facility]. Per son patient's spouse has passed away. Per son he is HPOA and lives in [place]. Son is agreeable for patient to return to [facility] FL2 complete and faxed out. CSW will continue to follow and assist as needed.   Employment status:  Retired Forensic scientist:  Medicare PT  Recommendations:  Not assessed at this time Information / Referral to community resources:  [place]  Patient/Family's Response to care:  Patient's son is HPOA and agrees for patient to return to [employer].   Patient/Family's Understanding of and Emotional Response to Diagnosis, Current Treatment, and Prognosis:  Patient's son was pleasant and thanked CSW for assistance.   Emotional Assessment Appearance:  Appears stated age Attitude/Demeanor/Rapport:  Unable to Assess Affect (typically observed):  Unable to Assess Orientation:  Oriented to Self, Oriented to Place Alcohol / Substance use:  Not Applicable Psych involvement (Current and /or in the community):  No (Comment)  Discharge Needs  Concerns to be addressed:  Discharge Planning Concerns Readmission within the last 30 days:  No Current discharge risk:  Dependent with Mobility Barriers to Discharge:  Continued Medical Work up   [employer], Veronia Beets, LCSW 10/11/2015, 2:32 PM

## 2015-10-11 NOTE — ED Notes (Signed)
MD at bedside. 

## 2015-10-11 NOTE — ED Triage Notes (Addendum)
Pt arrived via ems from [facility] after a witnessed fall. Pt states hitting the back of her head but denies loosing consciousness. Pt complains of left leg pain. Pt is alert and oriented upon assessment. No visual deformity.

## 2015-10-11 NOTE — ED Notes (Signed)
X-ray at bedside

## 2015-10-11 NOTE — Telephone Encounter (Signed)
Pt admitted to [facility] 10/11/15.

## 2015-10-11 NOTE — Progress Notes (Signed)
Anticoagulation monitoring(Lovenox):  82 yo  female ordered Lovenox 40 mg Q24h  Filed Weights   10/11/15 0509  Weight: 121 lb 6.4 oz (55.1 kg)   BMI    Lab Results  Component Value Date   CREATININE 1.07 (H) 10/11/2015   CREATININE 1.27 (H) 06/28/2015   CREATININE 1.20 (H) 06/27/2015   Estimated Creatinine Clearance: 27.2 mL/min (by C-G formula based on SCr of 1.07 mg/dL (H)). Hemoglobin & Hematocrit     Component Value Date/Time   HGB 11.5 (L) 10/11/2015 0853   HCT 34.5 (L) 10/11/2015 [id number]     Per Protocol for Patient with estCrcl < 30 ml/min and BMI < 40, will transition to Lovenox 30 mg Q24h.

## 2015-10-11 NOTE — H&P (Signed)
[place] [facility] [place] NAME: Arnel Hamblet    MR#:  [id number]  DATE OF BIRTH:  1918/08/08  DATE OF ADMISSION:  10/11/2015  PRIMARY CARE PHYSICIAN: Albina Billet, MD   REQUESTING/REFERRING PHYSICIAN: Dr. Meade Maw  CHIEF COMPLAINT:   Chief Complaint  Patient presents with  . Fall    HISTORY OF PRESENT ILLNESS:  Stephonie Samadi  is a 82 y.o. female with a known history of Ovarian cancer in remission, macular degeneration, hypothyroidism, multiple falls at home presents from assisted living facility secondary to fall and left hip fracture. Patient has a walker that she does not use inside the house. She says she got up last night to go use the bathroom, however her leg checked on the rug and she fell backwards. She denies any chest pain, syncopal episode. No loss of consciousness though she hit her head. Initially didn't realize that her left hip was hurting. CT of the head here in the emergency room did not reveal any acute findings other than chronic changes and moderate cerebral atrophy. Left hip x-ray done shows femoral neck fracture. Patient denies any chest pain, dyspnea, fevers or chills or nausea or vomiting. No prior cardiac history.  PAST MEDICAL HISTORY:   Past Medical History:  Diagnosis Date  . Cancer [facility]) 1984   ovarian /Dr Lottie Rater [facility]  . Macular degeneration   . Skin cancer   . Thyroid disease     PAST SURGICAL HISTORY:   Past Surgical History:  Procedure Laterality Date  . CHOLECYSTECTOMY    . COLONOSCOPY    . Orif Right    Lower leg  . Edon    SOCIAL HISTORY:   Social History  Substance Use Topics  . Smoking status: Never Smoker  . Smokeless tobacco: Never Used  . Alcohol use No    FAMILY HISTORY:   Family History  Problem Relation Age of Onset  . Dementia Mother   . CAD Father   . Rheum arthritis Neg Hx   . Osteoarthritis Neg Hx   . Asthma Neg  Hx   . Cancer Neg Hx   . Diabetes Neg Hx   . Heart failure Neg Hx     DRUG ALLERGIES:   Allergies  Allergen Reactions  . Levothyroxine Sodium Other (See Comments)    REVIEW OF SYSTEMS:   Review of Systems  Constitutional: Negative for chills, fever, malaise/fatigue and weight loss.  HENT: Negative for ear discharge, ear pain, nosebleeds and tinnitus.   Eyes: Positive for blurred vision. Negative for double vision and photophobia.  Respiratory: Negative for cough, hemoptysis, shortness of breath and wheezing.   Cardiovascular: Negative for chest pain, palpitations, orthopnea and leg swelling.  Gastrointestinal: Negative for abdominal pain, constipation, diarrhea, heartburn, melena, nausea and vomiting.  Genitourinary: Negative for dysuria, frequency, hematuria and urgency.  Musculoskeletal: Positive for falls, joint pain and myalgias. Negative for back pain and neck pain.  Skin: Negative for rash.  Neurological: Positive for weakness. Negative for dizziness, tingling, tremors, sensory change, speech change, focal weakness and headaches.  Endo/Heme/Allergies: Does not bruise/bleed easily.  Psychiatric/Behavioral: Negative for depression.    MEDICATIONS AT HOME:   Prior to Admission medications   Medication Sig Start Date End Date Taking? Authorizing Provider  aspirin 325 MG tablet Take 1 tablet (325 mg total) by mouth daily. 06/30/15  Yes Bettey Costa, MD  Biotin 1000 MCG  tablet Take 2,500 mcg by mouth every morning.   Yes Historical Provider, MD  brimonidine (ALPHAGAN) 0.15 % ophthalmic solution Place 1 drop into both eyes 2 (two) times daily. Reported on 06/26/2015 11/04/14  Yes Historical Provider, MD  cholecalciferol (VITAMIN D) 1000 units tablet Take 1,000 Units by mouth daily.   Yes Historical Provider, MD  Cyanocobalamin (VITAMIN B-12) 2500 MCG SUBL Place 2,500 mcg under the tongue every morning.   Yes Historical Provider, MD  felodipine (PLENDIL) 5 MG 24 hr tablet Take 5 mg by  mouth daily.  01/11/15  Yes Historical Provider, MD  ferrous sulfate 325 (65 FE) MG tablet Take 325 mg by mouth every morning.   Yes Historical Provider, MD  HYDROcodone-acetaminophen (NORCO/VICODIN) 5-325 MG tablet Take 1 tablet by mouth every 4 (four) hours as needed for moderate pain or severe pain. 06/30/15  Yes Sital Mody, MD  hypromellose (GENTEAL) 0.3 % GEL ophthalmic ointment Place 1 application into both eyes 4 (four) times daily.   Yes Historical Provider, MD  levothyroxine (SYNTHROID, LEVOTHROID) 75 MCG tablet Take 75 mcg by mouth every Monday, Wednesday, and Friday.   Yes Historical Provider, MD  naproxen sodium (ANAPROX) 220 MG tablet Take 220 mg by mouth 2 (two) times daily as needed. For pain   Yes Historical Provider, MD  polyethylene glycol (MIRALAX / GLYCOLAX) packet Take 17 g by mouth at bedtime.    Yes Historical Provider, MD  timolol (TIMOPTIC-XR) 0.5 % ophthalmic gel-forming Place 1 drop into both eyes 2 (two) times daily.   Yes Historical Provider, MD  vitamin E 400 UNIT capsule Take 400 Units by mouth daily with lunch.    Yes Historical Provider, MD  Zinc 50 MG TABS Take 50 mg by mouth every morning.   Yes Historical Provider, MD      VITAL SIGNS:  Blood pressure (!) 162/78, pulse 85, temperature 97.8 F (36.6 C), temperature source Oral, resp. rate 17, height 5\' 4"  (1.626 m), weight 55.1 kg (121 lb 6.4 oz), SpO2 95 %.  PHYSICAL EXAMINATION:   Physical Exam  GENERAL:  82 y.o.-year-old elderly patient lying in the bed with no acute distress.  EYES: Pupils equal, round, reactive to light and accommodation. No scleral icterus. Extraocular muscles intact.  HEENT: Head atraumatic, normocephalic. Oropharynx and nasopharynx clear.  NECK:  Supple, no jugular venous distention. No thyroid enlargement, no tenderness.  LUNGS: Normal breath sounds bilaterally, no wheezing, rales,rhonchi or crepitation. No use of accessory muscles of respiration. Decreased bibasilar breath  sounds CARDIOVASCULAR: S1, S2 normal. No rubs, or gallops. 3/6 systolic murmur present. ABDOMEN: Soft, nontender, nondistended. Bowel sounds present. No organomegaly or mass.  EXTREMITIES: No pedal edema, cyanosis, or clubbing.  NEUROLOGIC: Cranial nerves II through XII are intact. Muscle strength equal in all extremities, limited left leg movement from pain. Sensation intact. Gait not checked.  PSYCHIATRIC: The patient is alert and oriented x 3. Occasional confusion- thinks she is at home inspite of correcting. SKIN: No obvious rash, lesion, or ulcer. Old bruises, skin tears  LABORATORY PANEL:   CBC  Recent Labs Lab 10/11/15 0853  WBC 13.0*  HGB 11.5*  HCT 34.5*  PLT 255   ------------------------------------------------------------------------------------------------------------------  Chemistries   Recent Labs Lab 10/11/15 0853  NA 139  K 4.0  CL 106  CO2 24  GLUCOSE 97  BUN 32*  CREATININE 1.07*  CALCIUM 9.0  AST 18  ALT 14  ALKPHOS 82  BILITOT 0.5   ------------------------------------------------------------------------------------------------------------------  Cardiac Enzymes No results for  input(s): TROPONINI in the last 168 hours. ------------------------------------------------------------------------------------------------------------------  RADIOLOGY:  Ct Head Wo Contrast  Result Date: 10/11/2015 CLINICAL DATA:  Status post fall. EXAM: CT HEAD WITHOUT CONTRAST TECHNIQUE: Contiguous axial images were obtained from the base of the skull through the vertex without intravenous contrast. COMPARISON:  Head CT 08/01/2015 FINDINGS: Brain: No mass lesion, intraparenchymal hemorrhage or extra-axial collection. No evidence of acute cortical infarct. There is periventricular hypoattenuation compatible with chronic microvascular disease. There is moderate atrophy. Vascular: There is mild vertebrobasilar calcification. Skull: Normal visualized skull base, calvarium  and extracranial soft tissues. Sinuses/Orbits: No sinus fluid levels or advanced mucosal thickening. No mastoid effusion. Normal orbits. IMPRESSION: 1. No acute intracranial abnormality. 2. Chronic microvascular ischemia and moderate cerebral atrophy. Electronically Signed   By: Ulyses Jarred M.D.   On: 10/11/2015 06:37   Dg Chest Port 1 View  Result Date: 10/11/2015 CLINICAL DATA:  Status post fall.  Preop exam EXAM: PORTABLE CHEST 1 VIEW COMPARISON:  6/24/7 FINDINGS: Normal heart size. Aortic atherosclerosis noted. There are several nodular densities identified within the left upper lobe. These are indeterminate but not clearly identified on the previous radiograph. No airspace consolidation identified. No pleural effusion or edema. IMPRESSION: 1. No acute cardiopulmonary abnormalities. 2. Indeterminate nodular densities within the left upper lobe appear new from previous exam. Suggest followup imaging with nonemergent noncontrast CT of the chest. These results will be called to the ordering clinician or representative by the Radiologist Assistant, and communication documented in the PACS or zVision Dashboard. Electronically Signed   By: Kerby Moors M.D.   On: 10/11/2015 09:43   Dg Hip Unilat W Or Wo Pelvis 2-3 Views Left  Result Date: 10/11/2015 CLINICAL DATA:  Fall last night onto left side.  Left hip pain. EXAM: DG HIP (WITH OR WITHOUT PELVIS) 2-3V LEFT COMPARISON:  Radiographs from 01/15/2015 FINDINGS: Acute left femoral neck fracture extending from the midportion of the femoral neck medially to the subcapital portion laterally. The left hip is internally rotated on the frontal projections. Mild spurring of the pubis. Numerous clips project over the anatomic pelvis and lower abdomen. Bony demineralization. IMPRESSION: 1. Acute left femoral neck fracture. Electronically Signed   By: Van Clines M.D.   On: 10/11/2015 08:10    EKG:   Orders placed or performed during the hospital encounter  of 10/11/15  . ED EKG  . ED EKG  . EKG 12-Lead  . EKG 12-Lead  . EKG 12-Lead  . EKG 12-Lead    IMPRESSION AND PLAN:   Imara Fells  is a 82 y.o. female with a known history of Ovarian cancer in remission, macular degeneration, hypothyroidism, multiple falls at home presents from assisted living facility secondary to fall and left hip fracture.  #1 left femoral neck fracture-orthopedics consulted. -Patient is moderate risk for low-risk surgery. Can proceed with surgery. -Currently nothing by mouth. Hold off any anticoagulation for prophylaxis -Pain medications. Physical therapy consult after surgery.  #2 leukocytosis-could be from fracture, stress margination. -Check urinalysis to rule out infection.  #3 hypertension-continue felodipine  #4 macular degeneration-continue eyedrops. Also follows with ophthalmology every monthly for injections  #5 DVT prophylaxis-will be started after surgery    All the records are reviewed and case discussed with ED provider. Management plans discussed with the patient, family and they are in agreement.  CODE STATUS: Full code. Need to verify living will papers  TOTAL TIME TAKING CARE OF THIS PATIENT: 50 minutes.    Gladstone Lighter M.D on 10/11/2015 at  12:51 PM  Between 7am to 6pm - Pager - [phone]  After 6pm go to [email or web address] - password [email or web address]  Office  [phone]  CC: Primary care physician; Albina Billet, MD

## 2015-10-11 NOTE — Telephone Encounter (Signed)
PLEASE NOTE: All timestamps contained within this report are represented as [place] Standard Time. CONFIDENTIALTY NOTICE: This fax transmission is intended only for the addressee. It contains information that is legally privileged, confidential or otherwise protected from use or disclosure. If you are not the intended recipient, you are strictly prohibited from reviewing, disclosing, copying using or disseminating any of this information or taking any action in reliance on or regarding this information. If you have received this fax in error, please notify us immediately by telephone so that we can arrange for its return to [place]. Phone: [phone], Toll-Free: [phone], Fax: [phone] Page: 1 of 2 Call Id: [id number] [place] Patient Name: Traci Vaughan Gender: Female DOB: 19-Jun-1918 Age: 82 Y 71 M 15 D Return Phone Number: [phone] (Primary) Address: City/State/Zip: [place] Client [place] Night - Client Client Site [place] Physician Viviana Simpler - MD Contact Type Call Who Is Calling Patient / Member / Family / Caregiver Call Type Triage / Clinical Caller Name [facility] [place] Relationship To Patient Care [place] Return Phone Number [phone] (Primary) Chief Complaint Paging or Request for Consult Reason for Call Symptomatic / Request for Health Information Initial Comment Pt fell at 3 am, @ 4 am she was complaining of pain in the left thigh, pt was sent out for evaluation - needing an order for approval of sending pt to the ED [facility] faxed again. Translation No No Triage Reason Other Nurse Assessment Nurse: Neil Crouch, RN, Baker Janus Date/Time Eilene Ghazi Time): 10/11/2015 5:45:36 AM Confirm and document reason for call. If symptomatic, describe symptoms. You must click the next button to save  text entered. ---Pt fell at 3 am, @ 4 am she was complaining of pain in the left thigh, pt was sent out for evaluation - needing an order for approval of sending pt to the ED. nurse states there was not time for them to see if mobile was available. Has the patient traveled out of the country within the last 30 days? ---Not Applicable Does the patient have any new or worsening symptoms? ---Yes Will a triage be completed? ---No Select reason for no triage. ---Other Guidelines Guideline Title Affirmed Question Affirmed Notes Nurse Date/Time ([place] Time) Disp. Time Eilene Ghazi Time) Disposition Final User 10/11/2015 5:36:13 AM Send To Call Back Waiting For Nurse Gae Gallop 10/11/2015 5:47:51 AM Clinical Call Yes Neil Crouch, RN, Baker Janus Comments User: George Ina, RN Date/Time Eilene Ghazi Time): 10/11/2015 5:47:41 AM PLEASE NOTE: All timestamps contained within this report are represented as [place] Standard Time. CONFIDENTIALTY NOTICE: This fax transmission is intended only for the addressee. It contains information that is legally privileged, confidential or otherwise protected from use or disclosure. If you are not the intended recipient, you are strictly prohibited from reviewing, disclosing, copying using or disseminating any of this information or taking any action in reliance on or regarding this information. If you have received this fax in error, please notify us immediately by telephone so that we can arrange for its return to [place]. Phone: [phone], Toll-Free: [phone], Fax: [phone] Page: 2 of 2 Call Id: [id number] Comments pr standing orders for falls with injury--May order x-rays of injured area if mobile is available, if not sent to ED for evaluation.

## 2015-10-11 NOTE — Progress Notes (Signed)
Patient was sent to OR via the room bed and transport. NSL in [place]. Signed consent, chart, and IV ABX sent with patient. Son at bedside.

## 2015-10-11 NOTE — Anesthesia Procedure Notes (Signed)
Spinal  End time: 10/11/2015 4:12 PM Staffing Anesthesiologist: Gunnar Fusi Resident/CRNA: Jonna Clark Performed: anesthesiologist  Spinal Block Patient position: right lateral decubitus Prep: Betadine Patient monitoring: heart rate, continuous pulse ox and blood pressure Approach: midline Location: L3-4 Injection technique: single-shot Needle Needle type: Whitacre  Needle gauge: 24 G Needle length: 9 cm Catheter size: 20 g Assessment Sensory level: T10

## 2015-10-11 NOTE — Progress Notes (Signed)
Pt from ER. Alert and Oriented X4 but forgetful. Has NS lock in rt ac. Son at bedside. Many skin issues. Dressing in place from [facility] removed and re-dressed. Ecchymosis bilateral arms and legs at different healing stages. Bed alarm on. IV fluids started and POC initiated. Waiting for Ortho MD to see pt.

## 2015-10-11 NOTE — Progress Notes (Signed)
Patient returned to room 142 from surgery, sleepy, but able to arouse. Foot pumps in place. One small Honeycomb dressing to left hip. Heels elevated. Foley in place draining yellow urine. Bed alarm on for safety.

## 2015-10-11 NOTE — ED Provider Notes (Signed)
-----------------------------------------   9:34 AM on 10/11/2015 -----------------------------------------   Blood pressure (!) 150/78, pulse 75, temperature 97.1 F (36.2 C), temperature source Oral, resp. rate 14, height 5\' 4"  (1.626 m), weight 121 lb 6.4 oz (55.1 kg), SpO2 93 %.  Assuming care from Dr. Claudette Head.  In short, Traci Vaughan is a 82 y.o. female with a chief complaint of Fall .  Refer to the original H&P for additional details.  The current plan of care is to *following the results of the patient's x-ray of her left hip  "Ct Head Wo Contrast  Result Date: 10/11/2015 CLINICAL DATA:  Status post fall. EXAM: CT HEAD WITHOUT CONTRAST TECHNIQUE: Contiguous axial images were obtained from the base of the skull through the vertex without intravenous contrast. COMPARISON:  Head CT 08/01/2015 FINDINGS: Brain: No mass lesion, intraparenchymal hemorrhage or extra-axial collection. No evidence of acute cortical infarct. There is periventricular hypoattenuation compatible with chronic microvascular disease. There is moderate atrophy. Vascular: There is mild vertebrobasilar calcification. Skull: Normal visualized skull base, calvarium and extracranial soft tissues. Sinuses/Orbits: No sinus fluid levels or advanced mucosal thickening. No mastoid effusion. Normal orbits. IMPRESSION: 1. No acute intracranial abnormality. 2. Chronic microvascular ischemia and moderate cerebral atrophy. Electronically Signed   By: Ulyses Jarred M.D.   On: 10/11/2015 06:37   Dg Hip Unilat W Or Wo Pelvis 2-3 Views Left  Result Date: 10/11/2015 CLINICAL DATA:  Fall last night onto left side.  Left hip pain. EXAM: DG HIP (WITH OR WITHOUT PELVIS) 2-3V LEFT COMPARISON:  Radiographs from 01/15/2015 FINDINGS: Acute left femoral neck fracture extending from the midportion of the femoral neck medially to the subcapital portion laterally. The left hip is internally rotated on the frontal projections. Mild spurring of the pubis.  Numerous clips project over the anatomic pelvis and lower abdomen. Bony demineralization. IMPRESSION: 1. Acute left femoral neck fracture. Electronically Signed   By: Van Clines M.D.   On: 10/11/2015 08:10  "  Patient had preoperative testing initiated for what appears to be a left femoral neck fracture.  I spoke to orthopedics Dr. Roland Rack and the hospitalist team for admission. Patient was given IV morphine for pain control.  ED ECG REPORT I, Daymon Larsen, the attending physician, personally viewed and interpreted this ECG.  Date: 10/11/2015 EKG Time: 0854Rate: 74Rhythm: normal sinus rhythm with occasional premature atrial beat QRS Axis: normal Intervals: normal ST/T Wave abnormalities: normal Conduction Disturbances: none Narrative Interpretation: unremarkable No acute ischemic changes  Assessment: Left femoral neck fracture  Plan: Inpatient management    Daymon Larsen, MD 10/11/15 [phone]

## 2015-10-11 NOTE — ED Notes (Signed)
Pt changed and pericare performed at this time. Pt on way to floor by ED Tech Crystal. Pt alert and oriented X4, active, cooperative, pt in NAD. RR even and unlabored, color WNL.  Sent with all belongings present in room.

## 2015-10-11 NOTE — ED Notes (Signed)
Patient transported to CT 

## 2015-10-11 NOTE — ED Notes (Signed)
Patient transported to X-ray 

## 2015-10-11 NOTE — Transfer of Care (Signed)
Immediate Anesthesia Transfer of Care Note  Patient: DAIQUIRI MECHANIC  Procedure(s) Performed: Procedure(s): CANNULATED HIP PINNING (Left)  Patient Location: PACU  Anesthesia Type:Spinal  Level of Consciousness: awake  Airway & Oxygen Therapy: Patient Spontanous Breathing and Patient connected to nasal cannula oxygen  Post-op Assessment: Report given to RN and Post -op Vital signs reviewed and stable  Post vital signs: Reviewed and stable  Last Vitals:  Vitals:   10/11/15 1032 10/11/15 1723  BP: (!) 162/78 (!) 136/58  Pulse: 85 77  Resp: 17 19  Temp: 36.6 C 36.6 C    Last Pain:  Vitals:   10/11/15 1032  TempSrc: Oral  PainSc:          Complications: No apparent anesthesia complications

## 2015-10-12 ENCOUNTER — Encounter: Payer: Self-pay | Admitting: Surgery

## 2015-10-12 LAB — URINALYSIS COMPLETE WITH MICROSCOPIC (ARMC ONLY)
BILIRUBIN URINE: NEGATIVE
Bacteria, UA: NONE SEEN
Glucose, UA: NEGATIVE mg/dL
KETONES UR: NEGATIVE mg/dL
Leukocytes, UA: NEGATIVE
Nitrite: NEGATIVE
Protein, ur: NEGATIVE mg/dL
SQUAMOUS EPITHELIAL / LPF: NONE SEEN
Specific Gravity, Urine: 1.011 (ref 1.005–1.030)
pH: 7 (ref 5.0–8.0)

## 2015-10-12 LAB — CBC
HCT: 31.1 % — ABNORMAL LOW (ref 35.0–47.0)
Hemoglobin: 10.3 g/dL — ABNORMAL LOW (ref 12.0–16.0)
MCH: 25.6 pg — AB (ref 26.0–34.0)
MCHC: 33.2 g/dL (ref 32.0–36.0)
MCV: 77.1 fL — AB (ref 80.0–100.0)
PLATELETS: 225 10*3/uL (ref 150–440)
RBC: 4.02 MIL/uL (ref 3.80–5.20)
RDW: 18.9 % — AB (ref 11.5–14.5)
WBC: 9.2 10*3/uL (ref 3.6–11.0)

## 2015-10-12 LAB — BASIC METABOLIC PANEL
Anion gap: 7 (ref 5–15)
BUN: 23 mg/dL — AB (ref 6–20)
CALCIUM: 8.5 mg/dL — AB (ref 8.9–10.3)
CHLORIDE: 107 mmol/L (ref 101–111)
CO2: 25 mmol/L (ref 22–32)
CREATININE: 1.01 mg/dL — AB (ref 0.44–1.00)
GFR calc non Af Amer: 46 mL/min — ABNORMAL LOW (ref >60)
GFR, EST AFRICAN AMERICAN: 53 mL/min — AB (ref >60)
Glucose, Bld: 108 mg/dL — ABNORMAL HIGH (ref 65–99)
Potassium: 3.6 mmol/L (ref 3.5–5.1)
SODIUM: 139 mmol/L (ref 135–145)

## 2015-10-12 MED ORDER — CHLORHEXIDINE GLUCONATE CLOTH 2 % EX PADS: 6.0000 | MEDICATED_PAD | Freq: Every day | CUTANEOUS | Status: DC

## 2015-10-12 MED ORDER — MUPIROCIN 2 % EX OINT: 1.0000 "application " | TOPICAL_OINTMENT | Freq: Two times a day (BID) | CUTANEOUS | Status: DC

## 2015-10-12 MED ORDER — MUPIROCIN 2 % EX OINT
1.0000 "application " | TOPICAL_OINTMENT | Freq: Two times a day (BID) | CUTANEOUS | Status: DC
  Administered 2015-10-12 – 2015-10-14 (×5): 1 via NASAL
  Filled 2015-10-12: qty 22

## 2015-10-12 MED ORDER — CHLORHEXIDINE GLUCONATE CLOTH 2 % EX PADS
6.0000 | MEDICATED_PAD | Freq: Every day | CUTANEOUS | Status: DC
  Administered 2015-10-12 – 2015-10-14 (×3): 6 via TOPICAL

## 2015-10-12 NOTE — Anesthesia Postprocedure Evaluation (Signed)
Anesthesia Post Note  Patient: Traci Vaughan  Procedure(s) Performed: Procedure(s) (LRB): CANNULATED HIP PINNING (Left)  Patient location during evaluation: Nursing Unit Anesthesia Type: Spinal Level of consciousness: awake and alert and oriented Pain management: satisfactory to patient Vital Signs Assessment: post-procedure vital signs reviewed and stable Respiratory status: respiratory function stable Cardiovascular status: blood pressure returned to baseline Postop Assessment: no headache, no backache, spinal receding, no signs of nausea or vomiting, adequate PO intake and patient able to bend at knees Anesthetic complications: no    Last Vitals:  Vitals:   10/12/15 0356 10/12/15 0809  BP: (!) 148/67 (!) 162/71  Pulse: (!) 104 89  Resp: 16 18  Temp: 36.6 C 36.4 C    Last Pain:  Vitals:   10/12/15 0809  TempSrc: Oral  PainSc:                  Blima Singer

## 2015-10-12 NOTE — Evaluation (Signed)
Physical Therapy Evaluation Patient Details Name: IFE CRAPSER MRN: [id number] DOB: 11-Nov-1918 Today's Date: 10/12/2015   History of Present Illness  Jawanna Glazier  is a 82 y.o. female with a known history of Ovarian cancer in remission, macular degeneration, hypothyroidism, multiple falls at home presents from assisted living facility secondary to fall and left hip fracture. Pt underwent L hip pinning without reported post-op complications. She is POD#1 at time of initial evaluation. Pt reports 5-6 falls in the last 12 months. Pt does appear intermittently confused and it is unclear if there are innacuracies in her history.   Clinical Impression  Pt admitted with above diagnosis. Pt currently with functional limitations due to the deficits listed below (see PT Problem List).  Pt is acutely weak today with difficulty performing bed mobility and transfers requiring modA+2. She is unable to ambulate at this time as she lacks adequate UE strength to maintain TTWB on LLE and hop on RLE. Pt able to perform stand pivot transfer from bed to recliner with modA+2 and rolling walker. Pt will need SNF placement at discharge in order to facilitate return to prior level of function Pt will benefit from skilled PT services to address deficits in strength, balance, and mobility in order to return to full function at home.     Follow Up Recommendations SNF    Equipment Recommendations  None recommended by PT (TBD at facility. Will need to use a rolling walker for mobility)    Recommendations for Other Services       Precautions / Restrictions Precautions Precautions: Fall Restrictions Weight Bearing Restrictions: Yes LLE Weight Bearing: Touchdown weight bearing      Mobility  Bed Mobility Overal bed mobility: Needs Assistance Bed Mobility: Supine to Sit     Supine to sit: Mod assist;+2 for safety/equipment     General bed mobility comments: Cues for rolling and moving from sidelying to sitting.  Considerable assist for LLE and well as trunk support  Transfers Overall transfer level: Needs assistance Equipment used: Rolling walker (2 wheeled) Transfers: Sit to/from Stand Sit to Stand: Mod assist         General transfer comment: Pt requires cues to maintain TTWB on LLE during transfers by placing therapist's foot under patients foot. Pt unable to hop on RLE and maintain TTWB on LLE without heavy support. Pt able to perform stand pivot transfer with modA+2 from bed to recliner. Unable to ambulate at this time with WB restrictions.   Ambulation/Gait             General Gait Details: Unable  Stairs            Wheelchair Mobility    Modified Rankin (Stroke Patients Only)       Balance Overall balance assessment: Needs assistance Sitting-balance support: No upper extremity supported Sitting balance-Leahy Scale: Fair     Standing balance support: Bilateral upper extremity supported Standing balance-Leahy Scale: Poor                               Pertinent Vitals/Pain Pain Assessment: 0-10 Pain Score: 5  Pain Location: L hip, Denies L hip pain at rest but reports pain with movement Pain Descriptors / Indicators: Aching Pain Intervention(s): Monitored during session;Premedicated before session    Home Living Family/patient expects to be discharged to:: Skilled nursing facility                 Additional  Comments: Pt has been a resident of [facility] since June 2017. "I'm just there for rehab, I'm not there to stay"    Prior Function Level of Independence: Needs assistance   Gait / Transfers Assistance Needed: Pt reports she ambulates facility distances without assistive device.   ADL's / Homemaking Assistance Needed: Pt has been requiring assist with bathing/dressing at [facility]        Hand Dominance   Dominant Hand: Right    Extremity/Trunk Assessment   Upper Extremity Assessment: Generalized weakness            Lower Extremity Assessment: Generalized weakness;LLE deficits/detail   LLE Deficits / Details: Pt able to perform SLR and LAQ with LLE but with increased difficulty and pain. Limited and weak L hip flexion in sitting. Full ankle DF/PF bilaterally. Denies N/T in LLE     Communication   Communication: No difficulties  Cognition Arousal/Alertness: Awake/alert Behavior During Therapy: WFL for tasks assessed/performed Overall Cognitive Status: No family/caregiver present to determine baseline cognitive functioning (AOx4 but requires cues. Appears intermittently confused)                      General Comments      Exercises General Exercises - Lower Extremity Ankle Circles/Pumps: Strengthening;Both;10 reps;Supine Quad Sets: Strengthening;Both;Supine;10 reps Gluteal Sets: Strengthening;Supine;Both;10 reps Short Arc Quad: Strengthening;Left;10 reps;Supine Heel Slides: Strengthening;Left;Supine;10 reps Hip ABduction/ADduction: Strengthening;Left;Supine;10 reps Straight Leg Raises: Strengthening;Left;Supine;10 reps   Assessment/Plan    PT Assessment Patient needs continued PT services  PT Problem List Decreased strength;Decreased balance;Decreased mobility;Decreased knowledge of precautions;Pain          PT Treatment Interventions DME instruction;Gait training;Stair training;Functional mobility training;Therapeutic activities;Therapeutic exercise;Balance training;Neuromuscular re-education;Patient/family education;Manual techniques    PT Goals (Current goals can be found in the Care Plan section)  Acute Rehab PT Goals Patient Stated Goal: Return to prior level of function at [facility] PT Goal Formulation: With patient Time For Goal Achievement: 10/26/15 Potential to Achieve Goals: Fair    Frequency BID   Barriers to discharge        Co-evaluation               End of Session Equipment Utilized During Treatment: Gait belt Activity Tolerance: Patient  tolerated treatment well Patient left: in chair;with call bell/phone within reach;with chair alarm set;with SCD's reapplied Nurse Communication: Mobility status;Weight bearing status;Other (comment) (Need for ice for L hip pack)         Time: [id number] PT Time Calculation (min) (ACUTE ONLY): 30 min   Charges:   PT Evaluation $PT Eval Moderate Complexity: 1 Procedure PT Treatments $Therapeutic Exercise: 8-22 mins   PT G Codes:       Lyndel Safe Kenna Seward PT, DPT   Chayton Murata 10/12/2015, 9:31 AM

## 2015-10-12 NOTE — Progress Notes (Signed)
Physical Therapy Treatment Patient Details Name: Traci Vaughan MRN: [id number] DOB: 1918/01/20 Today's Date: 10/12/2015    History of Present Illness Traci Vaughan  is a 82 y.o. female with a known history of Ovarian cancer in remission, macular degeneration, hypothyroidism, multiple falls at home presents from assisted living facility secondary to fall and left hip fracture. Pt underwent L hip pinning without reported post-op complications. She is POD#1 at time of initial evaluation. Pt reports 5-6 falls in the last 12 months. Pt does appear intermittently confused and it is unclear if there are innacuracies in her history.     PT Comments    Pt appears slightly more confused this afternoon compared to this morning. She has increased difficulty following commands for sequencing during transfer back to bed. Pt still unable to ambulate this afternoon and actually does worse maintaining TTWB on LLE during transfer. Pt able to complete all seated and supine exercises but with increase in LLE pain. Pt will benefit from skilled PT services to address deficits in strength, balance, and mobility in order to return to full function at home.   Follow Up Recommendations  SNF     Equipment Recommendations  None recommended by PT (TBD at facility. Will need to use a rolling walker for mobility)    Recommendations for Other Services       Precautions / Restrictions Precautions Precautions: Fall Restrictions Weight Bearing Restrictions: Yes LLE Weight Bearing: Touchdown weight bearing    Mobility  Bed Mobility Overal bed mobility: Needs Assistance Bed Mobility: Sit to Supine       Sit to supine: Mod assist;+2 for physical assistance   General bed mobility comments: Pt requires assist for trunk and bilateral LE with returning to bed  Transfers Overall transfer level: Needs assistance Equipment used: Rolling walker (2 wheeled) Transfers: Sit to/from Stand Sit to Stand: Mod assist;+2  physical assistance         General transfer comment: Pt has increased difficulty this afternoon maintaing TTWB on LLE during transfers. Appears slightly more confused with difficulty following commands for sequencing. Pt required modA+2 to transfer from recliner back to bed. Hand over hand assist for use of walker and therapists foot under patient's foot to limit WB  Ambulation/Gait             General Gait Details: Unable   Stairs            Wheelchair Mobility    Modified Rankin (Stroke Patients Only)       Balance Overall balance assessment: Needs assistance Sitting-balance support: No upper extremity supported Sitting balance-Leahy Scale: Fair     Standing balance support: Bilateral upper extremity supported Standing balance-Leahy Scale: Poor                      Cognition Arousal/Alertness: Awake/alert Behavior During Therapy: WFL for tasks assessed/performed Overall Cognitive Status: No family/caregiver present to determine baseline cognitive functioning (AOx3 but requires cues. Confused to location this PM)                      Exercises General Exercises - Lower Extremity Ankle Circles/Pumps: Strengthening;Both;Supine;15 reps Quad Sets: Strengthening;Both;Supine;10 reps Gluteal Sets: Strengthening;Supine;Both;10 reps Short Arc Quad: Strengthening;Left;Supine;15 reps Heel Slides: Strengthening;Left;Supine;15 reps Hip ABduction/ADduction: Strengthening;Left;Supine;15 reps Straight Leg Raises: Strengthening;Left;Supine;15 reps Hip Flexion/Marching: Strengthening;Both;10 reps;Seated    General Comments        Pertinent Vitals/Pain Pain Assessment: 0-10 Pain Score: 6  Pain Location: L hip,  continues to deny pain at rest but reports pain with movement Pain Descriptors / Indicators: Aching Pain Intervention(s): Monitored during session;Limited activity within patient's tolerance    Home Living                      Prior  Function            PT Goals (current goals can now be found in the care plan section) Acute Rehab PT Goals Patient Stated Goal: Return to prior level of function at [facility] PT Goal Formulation: With patient Time For Goal Achievement: 10/26/15 Potential to Achieve Goals: Fair Progress towards PT goals: Progressing toward goals    Frequency    BID      PT Plan Current plan remains appropriate    Co-evaluation             End of Session Equipment Utilized During Treatment: Gait belt Activity Tolerance: Patient tolerated treatment well Patient left: with SCD's reapplied;in bed;with bed alarm set;with call bell/phone within reach;Other (comment) (ice pack on hip)     Time: [id number] PT Time Calculation (min) (ACUTE ONLY): 26 min  Charges:  $Therapeutic Exercise: 8-22 mins $Therapeutic Activity: 8-22 mins                    G Codes:      Traci Vaughan PT, DPT   Traci Vaughan 10/12/2015, 3:29 PM

## 2015-10-12 NOTE — Progress Notes (Signed)
Subjective: 1 Day Post-Op Procedure(s) (LRB): CANNULATED HIP PINNING (Left) Patient reports pain as mild.   Patient is well, and has had no acute complaints or problems Plan is to go Skilled nursing facility after hospital stay. Negative for chest pain and shortness of breath Fever: no Gastrointestinal:Negative for nausea and vomiting  Objective: Vital signs in last 24 hours: Temp:  [97.5 F (36.4 C)-98.6 F (37 C)] 97.8 F (36.6 C) (10/10 0356) Pulse Rate:  [62-104] 104 (10/10 0356) Resp:  [14-25] 16 (10/10 0356) BP: (117-176)/(50-96) 148/67 (10/10 0356) SpO2:  [93 %-100 %] 94 % (10/10 0356)  Intake/Output from previous day:  Intake/Output Summary (Last 24 hours) at 10/12/15 0750 Last data filed at 10/12/15 0405  Gross per 24 hour  Intake             1580 ml  Output              830 ml  Net              750 ml    Intake/Output this shift: No intake/output data recorded.  Labs:  Recent Labs  10/11/15 0853 10/12/15 0411  HGB 11.5* 10.3*    Recent Labs  10/11/15 0853 10/12/15 0411  WBC 13.0* 9.2  RBC 4.47 4.02  HCT 34.5* 31.1*  PLT 255 225    Recent Labs  10/11/15 0853 10/12/15 0411  NA 139 139  K 4.0 3.6  CL 106 107  CO2 24 25  BUN 32* 23*  CREATININE 1.07* 1.01*  GLUCOSE 97 108*  CALCIUM 9.0 8.5*    Recent Labs  10/11/15 0853  INR 1.04     EXAM General - Patient is Alert, Oriented and able to answer questions correctly, however when asked about her home situation, patient stated she lived with her husband but then told me that he had passed away several years ago. Extremity - ABD soft Sensation intact distally Intact pulses distally Dorsiflexion/Plantar flexion intact Incision: dressing C/D/I No cellulitis present Dressing/Incision - clean, dry, no drainage Motor Function - intact, moving foot and toes well on exam.   Abdomen soft with normal BS, without tympany.  Past Medical History:  Diagnosis Date  . Cancer [facility]) 1984   ovarian /Dr Lottie Rater [facility]  . Macular degeneration   . Skin cancer   . Thyroid disease     Assessment/Plan: 1 Day Post-Op Procedure(s) (LRB): CANNULATED HIP PINNING (Left) Active Problems:   Hip fracture (HCC)  Estimated body mass index is 20.84 kg/m as calculated from the following:   Height as of this encounter: 5\' 4"  (1.626 m).   Weight as of this encounter: 55.1 kg (121 lb 6.4 oz). Advance diet Up with therapy   Labs reviewed, BUN and Cr returning to baseline, good urine output. Foley still in place this AM. CBC and BMP ordered for tomorrow morning. PT to get patient up today, will likely need SNF following discharge, Care management to assist with discharge.  DVT Prophylaxis - Lovenox, Foot Pumps and TED hose Toe-touch Weight-Bearing to left leg  J. Cameron Proud, PA-C [facility] Orthopaedic Surgery 10/12/2015, 7:50 AM

## 2015-10-12 NOTE — Progress Notes (Signed)
[facility] [place] NAME: Traci Vaughan    MR#:  [id number]  DATE OF BIRTH:  14-Aug-1918  SUBJECTIVE:  CHIEF COMPLAINT:   Chief Complaint  Patient presents with  . Fall   - POD #1 after left hip pinning - foley removed, hasn't voided yet - complains of pain at left hip site  REVIEW OF SYSTEMS:  Review of Systems  Constitutional: Positive for malaise/fatigue. Negative for chills and fever.  HENT: Positive for hearing loss. Negative for ear discharge, ear pain and nosebleeds.   Eyes: Negative for blurred vision and double vision.  Respiratory: Negative for cough, shortness of breath and wheezing.   Cardiovascular: Negative for chest pain, palpitations and leg swelling.  Gastrointestinal: Negative for abdominal pain, constipation, diarrhea, nausea and vomiting.  Genitourinary: Negative for dysuria and urgency.  Musculoskeletal: Positive for joint pain.  Neurological: Negative for dizziness, sensory change, speech change, focal weakness, seizures and headaches.  Psychiatric/Behavioral: Positive for memory loss.    DRUG ALLERGIES:   Allergies  Allergen Reactions  . Levothyroxine Sodium Other (See Comments)    VITALS:  Blood pressure 110/83, pulse 81, temperature 98.1 F (36.7 C), temperature source Oral, resp. rate 16, height 5\' 4"  (1.626 m), weight 55.1 kg (121 lb 6.4 oz), SpO2 95 %.  PHYSICAL EXAMINATION:  Physical Exam  GENERAL:  81 y.o.-year-old patient sitting in the chair with no acute distress.  EYES: Pupils equal, round, reactive to light and accommodation. No scleral icterus. Extraocular muscles intact.  HEENT: Head atraumatic, normocephalic. Oropharynx and nasopharynx clear.  NECK:  Supple, no jugular venous distention. No thyroid enlargement, no tenderness.  LUNGS: Normal breath sounds bilaterally, no wheezing, rales,rhonchi or crepitation. No use of accessory muscles of respiration. Decreased bibasilar breath  sounds CARDIOVASCULAR: S1, S2 normal. No rubs, or gallops. 3/6 systolic murmur present. ABDOMEN: Soft, nontender, nondistended. Bowel sounds present. No organomegaly or mass.  EXTREMITIES: No pedal edema, cyanosis, or clubbing. Left hip dressing in place NEUROLOGIC: Cranial nerves II through XII are intact. Muscle strength equal in all extremities, limited left leg movement from pain. Sensation intact. Gait not checked.  PSYCHIATRIC: The patient is alert and oriented x 3. Occasional confusion- thinks she is at home inspite of correcting. SKIN: No obvious rash, lesion, or ulcer. Old bruises, skin tears   LABORATORY PANEL:   CBC  Recent Labs Lab 10/12/15 0411  WBC 9.2  HGB 10.3*  HCT 31.1*  PLT 225   ------------------------------------------------------------------------------------------------------------------  Chemistries   Recent Labs Lab 10/11/15 0853 10/12/15 0411  NA 139 139  K 4.0 3.6  CL 106 107  CO2 24 25  GLUCOSE 97 108*  BUN 32* 23*  CREATININE 1.07* 1.01*  CALCIUM 9.0 8.5*  AST 18  --   ALT 14  --   ALKPHOS 82  --   BILITOT 0.5  --    ------------------------------------------------------------------------------------------------------------------  Cardiac Enzymes No results for input(s): TROPONINI in the last 168 hours. ------------------------------------------------------------------------------------------------------------------  RADIOLOGY:  Ct Head Wo Contrast  Result Date: 10/11/2015 CLINICAL DATA:  Status post fall. EXAM: CT HEAD WITHOUT CONTRAST TECHNIQUE: Contiguous axial images were obtained from the base of the skull through the vertex without intravenous contrast. COMPARISON:  Head CT 08/01/2015 FINDINGS: Brain: No mass lesion, intraparenchymal hemorrhage or extra-axial collection. No evidence of acute cortical infarct. There is periventricular hypoattenuation compatible with chronic microvascular disease. There is moderate atrophy.  Vascular: There is mild vertebrobasilar calcification. Skull: Normal visualized skull base, calvarium and  extracranial soft tissues. Sinuses/Orbits: No sinus fluid levels or advanced mucosal thickening. No mastoid effusion. Normal orbits. IMPRESSION: 1. No acute intracranial abnormality. 2. Chronic microvascular ischemia and moderate cerebral atrophy. Electronically Signed   By: Ulyses Jarred M.D.   On: 10/11/2015 06:37   Dg Chest Port 1 View  Result Date: 10/11/2015 CLINICAL DATA:  Status post fall.  Preop exam EXAM: PORTABLE CHEST 1 VIEW COMPARISON:  6/24/7 FINDINGS: Normal heart size. Aortic atherosclerosis noted. There are several nodular densities identified within the left upper lobe. These are indeterminate but not clearly identified on the previous radiograph. No airspace consolidation identified. No pleural effusion or edema. IMPRESSION: 1. No acute cardiopulmonary abnormalities. 2. Indeterminate nodular densities within the left upper lobe appear new from previous exam. Suggest followup imaging with nonemergent noncontrast CT of the chest. These results will be called to the ordering clinician or representative by the Radiologist Assistant, and communication documented in the PACS or zVision Dashboard. Electronically Signed   By: Kerby Moors M.D.   On: 10/11/2015 09:43   Dg Hip Operative Unilat W Or W/o Pelvis Left  Result Date: 10/11/2015 CLINICAL DATA:  Intraoperative imaging for fixation of a left hip fracture sustained a fall last night. Initial encounter. EXAM: OPERATIVE LEFT HIP (WITH PELVIS IF PERFORMED) 2 VIEWS TECHNIQUE: Fluoroscopic spot image(s) were submitted for interpretation post-operatively. COMPARISON:  Plain films left hip 10/11/2015. FINDINGS: Two fluoroscopic spot views of the left hip demonstrate 3 screws in place for fixation of a subcapital fracture. Hardware appears well positioned. No acute abnormality. IMPRESSION: ORIF left hip fracture.  No acute abnormality.  Electronically Signed   By: Inge Rise M.D.   On: 10/11/2015 17:12   Dg Hip Unilat W Or Wo Pelvis 2-3 Views Left  Result Date: 10/11/2015 CLINICAL DATA:  Fall last night onto left side.  Left hip pain. EXAM: DG HIP (WITH OR WITHOUT PELVIS) 2-3V LEFT COMPARISON:  Radiographs from 01/15/2015 FINDINGS: Acute left femoral neck fracture extending from the midportion of the femoral neck medially to the subcapital portion laterally. The left hip is internally rotated on the frontal projections. Mild spurring of the pubis. Numerous clips project over the anatomic pelvis and lower abdomen. Bony demineralization. IMPRESSION: 1. Acute left femoral neck fracture. Electronically Signed   By: Van Clines M.D.   On: 10/11/2015 08:10    EKG:   Orders placed or performed during the hospital encounter of 10/11/15  . ED EKG  . ED EKG  . EKG 12-Lead  . EKG 12-Lead  . EKG 12-Lead  . EKG 12-Lead  . EKG    ASSESSMENT AND PLAN:   Traci Vaughan  is a 82 y.o. female with a known history of Ovarian cancer in remission, macular degeneration, hypothyroidism, multiple falls at home presents from assisted living facility secondary to fall and left hip fracture.  #1 left femoral neck fracture-orthopedics consulted. POD #1  -physical therapy started. Monitor anemia, no indication for blood transfusion -Pain medications.  - SNF at discharge  #2 leukocytosis- improved, likely from fracture, stress margination. -UA negative for any infection  #3 hypertension-continue felodipine  #4 macular degeneration-continue eyedrops. Also follows with ophthalmology every monthly for injections  #5 DVT prophylaxis- lovenox started after surgery   Will need rehab at discharge   All the records are reviewed and case discussed with Care Management/Social Workerr. Management plans discussed with the patient, family and they are in agreement.  CODE STATUS: Full Code  TOTAL TIME TAKING CARE OF THIS PATIENT:  37 minutes.   POSSIBLE D/C IN 1-2 DAYS, DEPENDING ON CLINICAL CONDITION.   Hillari Zumwalt M.D on 10/12/2015 at 1:08 PM  Between 7am to 6pm - Pager - [phone]  After 6pm go to [email or web address] - password [facility]  Office  [phone]  CC: Primary care physician; Albina Billet, MD

## 2015-10-13 LAB — BASIC METABOLIC PANEL
ANION GAP: 9 (ref 5–15)
BUN: 28 mg/dL — ABNORMAL HIGH (ref 6–20)
CALCIUM: 8.2 mg/dL — AB (ref 8.9–10.3)
CHLORIDE: 109 mmol/L (ref 101–111)
CO2: 21 mmol/L — AB (ref 22–32)
Creatinine, Ser: 1.33 mg/dL — ABNORMAL HIGH (ref 0.44–1.00)
GFR calc non Af Amer: 33 mL/min — ABNORMAL LOW (ref >60)
GFR, EST AFRICAN AMERICAN: 38 mL/min — AB (ref >60)
GLUCOSE: 116 mg/dL — AB (ref 65–99)
POTASSIUM: 3.6 mmol/L (ref 3.5–5.1)
Sodium: 139 mmol/L (ref 135–145)

## 2015-10-13 LAB — CBC
HEMATOCRIT: 31.4 % — AB (ref 35.0–47.0)
HEMOGLOBIN: 10.1 g/dL — AB (ref 12.0–16.0)
MCH: 25.4 pg — ABNORMAL LOW (ref 26.0–34.0)
MCHC: 32 g/dL (ref 32.0–36.0)
MCV: 79.2 fL — AB (ref 80.0–100.0)
Platelets: 210 10*3/uL (ref 150–440)
RBC: 3.97 MIL/uL (ref 3.80–5.20)
RDW: 18.8 % — AB (ref 11.5–14.5)
WBC: 9.1 10*3/uL (ref 3.6–11.0)

## 2015-10-13 MED ORDER — SODIUM CHLORIDE 0.9 % IV BOLUS (SEPSIS)
500.0000 mL | Freq: Once | INTRAVENOUS | Status: AC
  Administered 2015-10-13: 500 mL via INTRAVENOUS

## 2015-10-13 NOTE — Progress Notes (Signed)
Plan is for patient to D/C to [facility]. [facility], The admissions coordinator at [facility] is aware of above. Clinical Social Worker (CSW) will continue to follow and assist as needed.   [employer], LCSW [phone]

## 2015-10-13 NOTE — Progress Notes (Signed)
Physical Therapy Treatment Patient Details Name: Traci Vaughan MRN: [id number] DOB: 08/24/1918 Today's Date: 10/13/2015    History of Present Illness Traci Vaughan  is a 82 y.o. female with a known history of Ovarian cancer in remission, macular degeneration, hypothyroidism, multiple falls at home presents from assisted living facility secondary to fall and left hip fracture. Pt underwent L hip pinning without reported post-op complications. She is POD#1 at time of initial evaluation. Pt reports 5-6 falls in the last 12 months. Pt does appear intermittently confused and it is unclear if there are innacuracies in her history.     PT Comments    Pt agreeable to PT; notes pain in left hip continues a 6/10. Only medicated with Aleve. Pt participates in Bilateral lower extremities exercises in supine and seated; increased muscle guarding on the left and requires assist. Continue to require heavy Mod A for bed mobility for lower extremities and trunk; pt anxious and resist movement often retracting or undoing advancement gained. Requires heavy cueing for task and increased time/effort. Once in sit, pt has difficulty maintaining Left lower extremity on the ground requiring verbal and tactile cues. Assisted march and knee extension performed to relax guarding; with time it improves. +2 Min to Mod assist for stand and ambulation to the chair again requiring heavy cues. Pt has difficulty maintaining touch down weightbearing on the left. Pt notes improved comfort up in chair. Continue PT to progress strength, endurance, balance and understanding of weightbearing status for improved functional mobility.   Follow Up Recommendations  SNF     Equipment Recommendations       Recommendations for Other Services       Precautions / Restrictions Precautions Precautions: Fall Restrictions Weight Bearing Restrictions: Yes LLE Weight Bearing: Touchdown weight bearing    Mobility  Bed Mobility Overal bed  mobility: Needs Assistance Bed Mobility: Supine to Sit     Supine to sit: Mod assist     General bed mobility comments: Significant muscle guarding; fights movement requested due to mild anxiety  Transfers Overall transfer level: Needs assistance Equipment used: Rolling walker (2 wheeled) Transfers: Sit to/from Stand Sit to Stand: Min assist;+2 physical assistance         General transfer comment: Difficulty maintaining TWB on L. Cues for posture and hand placement as well as weightbearing compliance  Ambulation/Gait Ambulation/Gait assistance: Mod assist Ambulation Distance (Feet): 3 Feet Assistive device: Rolling walker (2 wheeled) Gait Pattern/deviations: Step-to pattern;Trunk flexed (non compliance with TWB on L)     General Gait Details: Difficulty maintaining TDW on L; alternates between steps and pivoting on RLE. Requires directions/sequence cueing   Stairs            Wheelchair Mobility    Modified Rankin (Stroke Patients Only)       Balance Overall balance assessment: Needs assistance Sitting-balance support: Bilateral upper extremity supported;Feet supported;Feet unsupported (difficulty maintaining feet on ground especially L) Sitting balance-Leahy Scale: Fair     Standing balance support: Bilateral upper extremity supported Standing balance-Leahy Scale: Poor Standing balance comment: Requires assist of 2 Min to Mod for stand with rw                    Cognition Arousal/Alertness: Awake/alert Behavior During Therapy: WFL for tasks assessed/performed Overall Cognitive Status: No family/caregiver present to determine baseline cognitive functioning (A/O to name, birthdate, situation; not place or year)  Exercises General Exercises - Lower Extremity Ankle Circles/Pumps: AROM;Both;20 reps Quad Sets: Strengthening;Both;20 reps Gluteal Sets: Strengthening;Both;20 reps Short Arc Quad: AROM;Both;20 reps;Supine Long  Arc Quad: AAROM;Left;10 reps;Seated Heel Slides: AAROM;Left;20 reps;Supine (increased muscle guarding; AROM R) Hip ABduction/ADduction: AROM;AAROM;Left;20 reps;Supine (AROM R) Straight Leg Raises: AAROM;10 reps;Supine;Both Hip Flexion/Marching: AAROM;Left;10 reps;Seated    General Comments        Pertinent Vitals/Pain Pain Assessment: 0-10 Pain Score: 6  Pain Location: L hip Pain Intervention(s): Monitored during session;Limited activity within patient's tolerance;Premedicated before session;Repositioned (took Aleve only)    Home Living                      Prior Function            PT Goals (current goals can now be found in the care plan section) Progress towards PT goals: Progressing toward goals    Frequency    BID      PT Plan Current plan remains appropriate    Co-evaluation             End of Session Equipment Utilized During Treatment: Gait belt Activity Tolerance: Other (comment);Patient tolerated treatment well (anxiety with movement) Patient left: in chair;with call bell/phone within reach;with chair alarm set     Time: [id number] PT Time Calculation (min) (ACUTE ONLY): 48 min  Charges:  $Gait Training: 8-22 mins $Therapeutic Exercise: 8-22 mins $Therapeutic Activity: 8-22 mins                    G CodesLarae Grooms, PTA 10/13/2015, 10:54 AM

## 2015-10-13 NOTE — Progress Notes (Signed)
Pts. Bladder scan showed greater than 700. In and out cath x1 per Dr. Tressia Miners

## 2015-10-13 NOTE — Progress Notes (Signed)
Physical Therapy Treatment Patient Details Name: Traci Vaughan MRN: [id number] DOB: Mar 11, 1918 Today's Date: 10/13/2015    History of Present Illness Traci Vaughan  is a 82 y.o. female with a known history of Ovarian cancer in remission, macular degeneration, hypothyroidism, multiple falls at home presents from assisted living facility secondary to fall and left hip fracture. Pt underwent L hip pinning without reported post-op complications. She is POD#1 at time of initial evaluation. Pt reports 5-6 falls in the last 12 months. Pt does appear intermittently confused and it is unclear if there are innacuracies in her history.     PT Comments    Pt demonstrates increased difficulty following commands with therapy on this date. She is able to perform transfer from recliner back to bed but is unable to truly ambulate. Pt has significant difficulty maintaining TTWB on LLE despite heavy verbal and tactile cues from therapist and attempted assist to keep weight off LLE. Pt able to complete all seated and supine exercises as instructed with increased time. Pt will benefit from skilled PT services to address deficits in strength, balance, and mobility in order to return to full function at home.   Follow Up Recommendations  SNF     Equipment Recommendations  None recommended by PT (TBD at facility. Will need to use a rolling walker for mobility)    Recommendations for Other Services       Precautions / Restrictions Precautions Precautions: Fall Restrictions Weight Bearing Restrictions: Yes LLE Weight Bearing: Touchdown weight bearing    Mobility  Bed Mobility Overal bed mobility: Needs Assistance Bed Mobility: Sit to Supine       Sit to supine: Mod assist;+2 for physical assistance   General bed mobility comments: Pt struggles to follow commands for suquencing. Requires modA+2 in order to go from sitting at EOB to supine  Transfers Overall transfer level: Needs assistance Equipment  used: Rolling walker (2 wheeled) Transfers: Sit to/from Stand Sit to Stand: Min assist;+2 physical assistance         General transfer comment: Pt has difficulty following cues and maintaing TTWB on LLE. Therapist places foot under patient's foot to help limit weight bearing and provide cues. Hand over hand as well as verbal and tactile cues for hand placement during transfers. Pt with increased difficulty performing transfers this afternoon  Ambulation/Gait Ambulation/Gait assistance: Mod assist Ambulation Distance (Feet): 3 Feet Assistive device: Rolling walker (2 wheeled)       General Gait Details: Pt has a lot of difficulty mainitaing TTWB on LLE during transfer. She is unable to truly ambulate and really only performs stand pivot transfer to bed with heavy cues.    Stairs            Wheelchair Mobility    Modified Rankin (Stroke Patients Only)       Balance Overall balance assessment: Needs assistance Sitting-balance support: No upper extremity supported Sitting balance-Leahy Scale: Fair     Standing balance support: Bilateral upper extremity supported Standing balance-Leahy Scale: Poor Standing balance comment: Very poor safety awareness and stability noted at this time                    Cognition Arousal/Alertness: Awake/alert Behavior During Therapy: WFL for tasks assessed/performed Overall Cognitive Status: No family/caregiver present to determine baseline cognitive functioning (pt with difficulty following commands today)                      Exercises General Exercises -  Lower Extremity Ankle Circles/Pumps: Both;Seated;15 reps;AROM Quad Sets: Strengthening;Both;15 reps;Supine Gluteal Sets: Strengthening;Both;15 reps;Supine Long Arc Quad: Left;Seated;Strengthening;15 reps Heel Slides: Left;Strengthening;15 reps;Seated (Performed x 10 in supine as well) Hip ABduction/ADduction: Left;Supine;AROM;15 reps Straight Leg Raises:  Supine;Strengthening;Left;15 reps Hip Flexion/Marching: Seated;Strengthening;Both;15 reps    General Comments        Pertinent Vitals/Pain Pain Assessment: Faces Faces Pain Scale: Hurts little more Pain Location: L hip, pt unable to answer questions this afternoon but does deny pain at rest Pain Intervention(s): Monitored during session;Limited activity within patient's tolerance    Home Living                      Prior Function            PT Goals (current goals can now be found in the care plan section) Acute Rehab PT Goals Patient Stated Goal: Return to prior level of function at [facility] PT Goal Formulation: With patient Time For Goal Achievement: 10/26/15 Potential to Achieve Goals: Fair Progress towards PT goals: Progressing toward goals    Frequency    BID      PT Plan Current plan remains appropriate    Co-evaluation             End of Session Equipment Utilized During Treatment: Gait belt Activity Tolerance: Patient tolerated treatment well Patient left: in bed;with bed alarm set;with call bell/phone within reach     Time: [id number] PT Time Calculation (min) (ACUTE ONLY): 24 min  Charges:  $Therapeutic Exercise: 8-22 mins $Therapeutic Activity: 8-22 mins                    G Codes:      Lyndel Safe Huprich PT, DPT   Huprich,Jason 10/13/2015, 4:16 PM

## 2015-10-13 NOTE — Progress Notes (Signed)
Subjective: 2 Days Post-Op Procedure(s) (LRB): CANNULATED HIP PINNING (Left) Patient reports pain as mild.   Patient is well, but has had some minor complaints of urinary retention Plan is to go Skilled nursing facility after hospital stay. Negative for chest pain and shortness of breath Fever: no Gastrointestinal:Negative for nausea and vomiting  Objective: Vital signs in last 24 hours: Temp:  [97.5 F (36.4 C)-98.5 F (36.9 C)] 98.1 F (36.7 C) (10/11 0814) Pulse Rate:  [73-90] 80 (10/11 0814) Resp:  [16-19] 18 (10/11 0814) BP: (110-154)/(49-83) 140/78 (10/11 0814) SpO2:  [92 %-95 %] 93 % (10/11 0814)  Intake/Output from previous day:  Intake/Output Summary (Last 24 hours) at 10/13/15 0823 Last data filed at 10/13/15 0707  Gross per 24 hour  Intake              950 ml  Output              600 ml  Net              350 ml    Intake/Output this shift: Total I/O In: -  Out: 600 [Urine:600]  Labs:  Recent Labs  10/11/15 0853 10/12/15 0411 10/13/15 0410  HGB 11.5* 10.3* 10.1*    Recent Labs  10/12/15 0411 10/13/15 0410  WBC 9.2 9.1  RBC 4.02 3.97  HCT 31.1* 31.4*  PLT 225 210    Recent Labs  10/12/15 0411 10/13/15 0410  NA 139 139  K 3.6 3.6  CL 107 109  CO2 25 21*  BUN 23* 28*  CREATININE 1.01* 1.33*  GLUCOSE 108* 116*  CALCIUM 8.5* 8.2*    Recent Labs  10/11/15 0853  INR 1.04     EXAM General - Patient is Alert, Appropriate and slightly lacking this AM but able to answer simple questions. Extremity - ABD soft Sensation intact distally Intact pulses distally Dorsiflexion/Plantar flexion intact Incision: dressing C/D/I No cellulitis present Dressing/Incision - clean, dry, no drainage Motor Function - intact, moving foot and toes well on exam.   Abdomen soft with normal BS, without tympany.  Past Medical History:  Diagnosis Date  . Cancer [facility]) 1984   ovarian /Dr Lottie Rater [facility]  . Macular degeneration   . Skin cancer   . Thyroid  disease     Assessment/Plan: 2 Days Post-Op Procedure(s) (LRB): CANNULATED HIP PINNING (Left) Active Problems:   Hip fracture (HCC)  Estimated body mass index is 20.84 kg/m as calculated from the following:   Height as of this encounter: 5\' 4"  (1.626 m).   Weight as of this encounter: 55.1 kg (121 lb 6.4 oz). Advance diet Up with therapy   Labs reviewed, BUN and Cr increasing, likely related to her urinary retention. In and Out Cath's have been performed. Continue to work with therapy on ambulation while remaining toe-touch weightbearing.  DVT Prophylaxis - Lovenox, Foot Pumps and TED hose Toe-touch Weight-Bearing to left leg  J. Cameron Proud, PA-C [facility] 10/13/2015, 8:23 AM

## 2015-10-13 NOTE — Progress Notes (Signed)
[place] [facility] [place] NAME: Traci Vaughan    MR#:  [id number]  DATE OF BIRTH:  01/24/1918  SUBJECTIVE:  CHIEF COMPLAINT:   Chief Complaint  Patient presents with  . Fall   - POD #2 after left hip pinning - worked better with physical therapy - urinary retention- got an in and catheterization  REVIEW OF SYSTEMS:  Review of Systems  Constitutional: Positive for malaise/fatigue. Negative for chills and fever.  HENT: Positive for hearing loss. Negative for ear discharge, ear pain and nosebleeds.   Eyes: Negative for blurred vision and double vision.  Respiratory: Negative for cough, shortness of breath and wheezing.   Cardiovascular: Negative for chest pain, palpitations and leg swelling.  Gastrointestinal: Negative for abdominal pain, constipation, diarrhea, nausea and vomiting.  Genitourinary: Negative for dysuria and urgency.  Musculoskeletal: Positive for joint pain.  Neurological: Negative for dizziness, sensory change, speech change, focal weakness, seizures and headaches.  Psychiatric/Behavioral: Positive for memory loss.    DRUG ALLERGIES:   Allergies  Allergen Reactions  . Levothyroxine Sodium Other (See Comments)    VITALS:  Blood pressure 140/78, pulse 82, temperature 98.1 F (36.7 C), temperature source Oral, resp. rate 18, height 5\' 4"  (1.626 m), weight 55.1 kg (121 lb 6.4 oz), SpO2 93 %.  PHYSICAL EXAMINATION:  Physical Exam  GENERAL:  82 y.o.-year-old patient sitting in the chair with no acute distress.  EYES: Pupils equal, round, reactive to light and accommodation. No scleral icterus. Extraocular muscles intact.  HEENT: Head atraumatic, normocephalic. Oropharynx and nasopharynx clear.  NECK:  Supple, no jugular venous distention. No thyroid enlargement, no tenderness.  LUNGS: Normal breath sounds bilaterally, no wheezing, rales,rhonchi or crepitation. No use of accessory muscles of respiration. Decreased  bibasilar breath sounds CARDIOVASCULAR: S1, S2 normal. No rubs, or gallops. 3/6 systolic murmur present. ABDOMEN: Soft, nontender, nondistended. Bowel sounds present. No organomegaly or mass.  EXTREMITIES: No pedal edema, cyanosis, or clubbing. Left hip dressing in place NEUROLOGIC: Cranial nerves II through XII are intact. Muscle strength equal in all extremities, limited left leg movement from pain. Sensation intact. Gait not checked.  PSYCHIATRIC: The patient is alert and oriented x 3. Occasional confusion- thinks she is at home inspite of correcting. SKIN: No obvious rash, lesion, or ulcer. Old bruises, skin tears   LABORATORY PANEL:   CBC  Recent Labs Lab 10/13/15 0410  WBC 9.1  HGB 10.1*  HCT 31.4*  PLT 210   ------------------------------------------------------------------------------------------------------------------  Chemistries   Recent Labs Lab 10/11/15 0853  10/13/15 0410  NA 139  < > 139  K 4.0  < > 3.6  CL 106  < > 109  CO2 24  < > 21*  GLUCOSE 97  < > 116*  BUN 32*  < > 28*  CREATININE 1.07*  < > 1.33*  CALCIUM 9.0  < > 8.2*  AST 18  --   --   ALT 14  --   --   ALKPHOS 82  --   --   BILITOT 0.5  --   --   < > = values in this interval not displayed. ------------------------------------------------------------------------------------------------------------------  Cardiac Enzymes No results for input(s): TROPONINI in the last 168 hours. ------------------------------------------------------------------------------------------------------------------  RADIOLOGY:  Dg Hip Operative Unilat W Or W/o Pelvis Left  Result Date: 10/11/2015 CLINICAL DATA:  Intraoperative imaging for fixation of a left hip fracture sustained a fall last night. Initial encounter. EXAM: OPERATIVE LEFT HIP (WITH PELVIS IF PERFORMED)  2 VIEWS TECHNIQUE: Fluoroscopic spot image(s) were submitted for interpretation post-operatively. COMPARISON:  Plain films left hip 10/11/2015.  FINDINGS: Two fluoroscopic spot views of the left hip demonstrate 3 screws in place for fixation of a subcapital fracture. Hardware appears well positioned. No acute abnormality. IMPRESSION: ORIF left hip fracture.  No acute abnormality. Electronically Signed   By: Inge Rise M.D.   On: 10/11/2015 17:12    EKG:   Orders placed or performed during the hospital encounter of 10/11/15  . ED EKG  . ED EKG  . EKG 12-Lead  . EKG 12-Lead  . EKG 12-Lead  . EKG 12-Lead  . EKG    ASSESSMENT AND PLAN:   Traci Vaughan  is a 82 y.o. female with a known history of Ovarian cancer in remission, macular degeneration, hypothyroidism, multiple falls at home presents from assisted living facility secondary to fall and left hip fracture.  #1 left femoral neck fracture-orthopedics consulted. POD #2  -physical therapy started. Hb stable, no indication for blood transfusion -Pain medications. Urinary retention post op- monitor- use foley if continues to retain - SNF at discharge  #2 leukocytosis- improved, likely from fracture, stress margination. - resolved -UA negative for any infection  #3 hypertension-continue felodipine  #4 macular degeneration-continue eyedrops. Also follows with ophthalmology every monthly for injections  #5 DVT prophylaxis- lovenox started after surgery   Will need rehab at discharge. Possible discharge tomorrow   All the records are reviewed and case discussed with Care Management/Social Workerr. Management plans discussed with the patient, family and they are in agreement.  CODE STATUS: Full Code  TOTAL TIME TAKING CARE OF THIS PATIENT: 37 minutes.   POSSIBLE D/C TOMORROW, DEPENDING ON CLINICAL CONDITION.   Gladstone Lighter M.D on 10/13/2015 at 12:47 PM  Between 7am to 6pm - Pager - [phone]  After 6pm go to [email or web address] - password [facility]  Office  [phone]  CC: Primary care physician; Albina Billet, MD

## 2015-10-13 NOTE — Progress Notes (Signed)
Patient bladder scan 335 Dr.Diamond notified no new orders given

## 2015-10-14 LAB — BASIC METABOLIC PANEL
Anion gap: 7 (ref 5–15)
BUN: 20 mg/dL (ref 6–20)
CALCIUM: 8.4 mg/dL — AB (ref 8.9–10.3)
CHLORIDE: 105 mmol/L (ref 101–111)
CO2: 25 mmol/L (ref 22–32)
CREATININE: 1.07 mg/dL — AB (ref 0.44–1.00)
GFR, EST AFRICAN AMERICAN: 50 mL/min — AB (ref >60)
GFR, EST NON AFRICAN AMERICAN: 43 mL/min — AB (ref >60)
Glucose, Bld: 100 mg/dL — ABNORMAL HIGH (ref 65–99)
Potassium: 3.4 mmol/L — ABNORMAL LOW (ref 3.5–5.1)
SODIUM: 137 mmol/L (ref 135–145)

## 2015-10-14 MED ORDER — ENOXAPARIN SODIUM 30 MG/0.3ML ~~LOC~~ SOLN: 30.0000 mg | SUBCUTANEOUS | 0 refills | Status: AC

## 2015-10-14 MED ORDER — HYDROCODONE-ACETAMINOPHEN 5-325 MG PO TABS: 1.0000 | ORAL_TABLET | Freq: Four times a day (QID) | ORAL | 0 refills | Status: AC | PRN

## 2015-10-14 MED ORDER — DOCUSATE SODIUM 100 MG PO CAPS: 100.0000 mg | ORAL_CAPSULE | Freq: Two times a day (BID) | ORAL | 0 refills | Status: AC

## 2015-10-14 MED ORDER — CHLORHEXIDINE GLUCONATE CLOTH 2 % EX PADS: 6.0000 | MEDICATED_PAD | Freq: Every day | CUTANEOUS | 0 refills | Status: AC

## 2015-10-14 NOTE — Discharge Summary (Signed)
[place] [facility] [place] NAME: Traci Vaughan    MR#:  [id number]  DATE OF BIRTH:  1918-10-19  DATE OF ADMISSION:  10/11/2015 ADMITTING PHYSICIAN: Gladstone Lighter, MD  DATE OF DISCHARGE: 10/14/2015  PRIMARY CARE PHYSICIAN: Albina Billet, MD    ADMISSION DIAGNOSIS:  Closed fracture of left hip, initial encounter ([place]) [S72.002A] Fall, initial encounter [W19.XXXA]  DISCHARGE DIAGNOSIS:  Active Problems:   Hip fracture ([place])   SECONDARY DIAGNOSIS:   Past Medical History:  Diagnosis Date  . Cancer [facility]) 1984   ovarian /Dr Lottie Rater [facility]  . Macular degeneration   . Skin cancer   . Thyroid disease     HOSPITAL COURSE:   Traci Vaughan a 82 y.o. femalewith a known history of Ovarian cancer in remission, macular degeneration, hypothyroidism, multiple falls at home presents from assisted living facility secondary to fall and left hip fracture.  #1 left femoral neck fracture-orthopedics consulted. POD #3  -physical therapy started. Hb stable, no indication for blood transfusion -Pain medications. Urinary retention post op- monitor- use foley if continues to retain - SNF at discharge- stable.  #2 leukocytosis- improved, likely from fracture, stress margination. - resolved -UA negative for any infection  #3 hypertension-continue felodipine  #4 macular degeneration-continue eyedrops. Also follows with ophthalmology every monthly for injections  #5 DVT prophylaxis- lovenox started after surgery for 2 weeks.   DISCHARGE CONDITIONS:   Stable.  CONSULTS OBTAINED:  Treatment Team:  Corky Mull, MD  DRUG ALLERGIES:   Allergies  Allergen Reactions  . Levothyroxine Sodium Other (See Comments)    DISCHARGE MEDICATIONS:   Current Discharge Medication List    START taking these medications   Details  Chlorhexidine Gluconate Cloth 2 % PADS Apply 6 each topically daily at 6 (six) AM. Qty: 10 each, Refills: 0     docusate sodium (COLACE) 100 MG capsule Take 1 capsule (100 mg total) by mouth 2 (two) times daily. Qty: 10 capsule, Refills: 0    enoxaparin (LOVENOX) 30 MG/0.3ML injection Inject 0.3 mLs (30 mg total) into the skin daily. For 14 days Qty: 14 Syringe, Refills: 0      CONTINUE these medications which have CHANGED   Details  HYDROcodone-acetaminophen (NORCO/VICODIN) 5-325 MG tablet Take 1 tablet by mouth every 6 (six) hours as needed for moderate pain or severe pain. Qty: 30 tablet, Refills: 0      CONTINUE these medications which have NOT CHANGED   Details  aspirin 325 MG tablet Take 1 tablet (325 mg total) by mouth daily. Qty: 30 tablet, Refills: 0    Biotin 1000 MCG tablet Take 2,500 mcg by mouth every morning.    brimonidine (ALPHAGAN) 0.15 % ophthalmic solution Place 1 drop into both eyes 2 (two) times daily. Reported on 06/26/2015    cholecalciferol (VITAMIN D) 1000 units tablet Take 1,000 Units by mouth daily.    Cyanocobalamin (VITAMIN B-12) 2500 MCG SUBL Place 2,500 mcg under the tongue every morning.    felodipine (PLENDIL) 5 MG 24 hr tablet Take 5 mg by mouth daily.     ferrous sulfate 325 (65 FE) MG tablet Take 325 mg by mouth every morning.    hypromellose (GENTEAL) 0.3 % GEL ophthalmic ointment Place 1 application into both eyes 4 (four) times daily.    levothyroxine (SYNTHROID, LEVOTHROID) 75 MCG tablet Take 75 mcg by mouth every Monday, Wednesday, and Friday.    naproxen sodium (ANAPROX) 220 MG tablet Take 220 mg by  mouth 2 (two) times daily as needed. For pain    polyethylene glycol (MIRALAX / GLYCOLAX) packet Take 17 g by mouth at bedtime.     timolol (TIMOPTIC-XR) 0.5 % ophthalmic gel-forming Place 1 drop into both eyes 2 (two) times daily.    vitamin E 400 UNIT capsule Take 400 Units by mouth daily with lunch.     Zinc 50 MG TABS Take 50 mg by mouth every morning.         DISCHARGE INSTRUCTIONS:    Follow with PMD in 1-2 weeks. Follow with otrho  clinic in 2 weeks.  If you experience worsening of your admission symptoms, develop shortness of breath, life threatening emergency, suicidal or homicidal thoughts you must seek medical attention immediately by calling 911 or calling your MD immediately  if symptoms less severe.  You Must read complete instructions/literature along with all the possible adverse reactions/side effects for all the Medicines you take and that have been prescribed to you. Take any new Medicines after you have completely understood and accept all the possible adverse reactions/side effects.   Please note  You were cared for by a hospitalist during your hospital stay. If you have any questions about your discharge medications or the care you received while you were in the hospital after you are discharged, you can call the unit and asked to speak with the hospitalist on call if the hospitalist that took care of you is not available. Once you are discharged, your primary care physician will handle any further medical issues. Please note that NO REFILLS for any discharge medications will be authorized once you are discharged, as it is imperative that you return to your primary care physician (or establish a relationship with a primary care physician if you do not have one) for your aftercare needs so that they can reassess your need for medications and monitor your lab values.    Today   CHIEF COMPLAINT:   Chief Complaint  Patient presents with  . Fall    HISTORY OF PRESENT ILLNESS:  Traci Vaughan  is a 82 y.o. female with a known history of Ovarian cancer in remission, macular degeneration, hypothyroidism, multiple falls at home presents from assisted living facility secondary to fall and left hip fracture. Patient has a walker that she does not use inside the house. She says she got up last night to go use the bathroom, however her leg checked on the rug and she fell backwards. She denies any chest pain, syncopal  episode. No loss of consciousness though she hit her head. Initially didn't realize that her left hip was hurting. CT of the head here in the emergency room did not reveal any acute findings other than chronic changes and moderate cerebral atrophy. Left hip x-ray done shows femoral neck fracture. Patient denies any chest pain, dyspnea, fevers or chills or nausea or vomiting. No prior cardiac history.  VITAL SIGNS:  Blood pressure (!) 150/56, pulse 87, temperature 98.4 F (36.9 C), temperature source Oral, resp. rate 16, height 5\' 4"  (1.626 m), weight 55.1 kg (121 lb 6.4 oz), SpO2 90 %.  I/O:   Intake/Output Summary (Last 24 hours) at 10/14/15 1016 Last data filed at 10/13/15 1733  Gross per 24 hour  Intake              500 ml  Output              150 ml  Net  350 ml    PHYSICAL EXAMINATION:   GENERAL:  82 y.o.-year-old patient sitting in the chair with no acute distress.  EYES: Pupils equal, round, reactive to light and accommodation. No scleral icterus. Extraocular muscles intact.  HEENT: Head atraumatic, normocephalic. Oropharynx and nasopharynx clear.  NECK: Supple, no jugular venous distention. No thyroid enlargement, no tenderness.  LUNGS: Normal breath sounds bilaterally, no wheezing, rales,rhonchi or crepitation. No use of accessory muscles of respiration. Decreased bibasilar breath sounds CARDIOVASCULAR: S1, S2 normal. No rubs, or gallops. 3/6 systolic murmur present. ABDOMEN: Soft, nontender, nondistended. Bowel sounds present. No organomegaly or mass.  EXTREMITIES: No pedal edema, cyanosis, or clubbing. Left hip dressing in place NEUROLOGIC: Cranial nerves II through XII are intact. Muscle strength equal in all extremities, limited left leg movement from pain. Sensation intact. Gait not checked.  PSYCHIATRIC: The patient is alert and oriented x 3. Occasional confusion- thinks she is at home inspite of correcting. SKIN: No obvious rash, lesion, or ulcer. Old bruises,  skin tears  DATA REVIEW:   CBC  Recent Labs Lab 10/13/15 0410  WBC 9.1  HGB 10.1*  HCT 31.4*  PLT 210    Chemistries   Recent Labs Lab 10/11/15 0853  10/14/15 0826  NA 139  < > 137  K 4.0  < > 3.4*  CL 106  < > 105  CO2 24  < > 25  GLUCOSE 97  < > 100*  BUN 32*  < > 20  CREATININE 1.07*  < > 1.07*  CALCIUM 9.0  < > 8.4*  AST 18  --   --   ALT 14  --   --   ALKPHOS 82  --   --   BILITOT 0.5  --   --   < > = values in this interval not displayed.  Cardiac Enzymes No results for input(s): TROPONINI in the last 168 hours.  Microbiology Results  Results for orders placed or performed during the hospital encounter of 10/11/15  Surgical PCR screen     Status: Abnormal   Collection Time: 10/11/15  1:20 PM  Result Value Ref Range Status   MRSA, PCR POSITIVE (A) NEGATIVE Final    Comment: RESULT CALLED TO, READ BACK BY AND VERIFIED WITH: JAMIE OWENS ON 10/11/15 AT 1548 BY [facility]    Staphylococcus aureus POSITIVE (A) NEGATIVE Final    Comment:        The Xpert SA Assay (FDA approved for NASAL specimens in patients over 1 years of age), is one component of a comprehensive surveillance program.  Test performance has been validated by [facility] for patients greater than or equal to 80 year old. It is not intended to diagnose infection nor to guide or monitor treatment.     RADIOLOGY:  No results found.  EKG:   Orders placed or performed during the hospital encounter of 10/11/15  . ED EKG  . ED EKG  . EKG 12-Lead  . EKG 12-Lead  . EKG 12-Lead  . EKG 12-Lead  . EKG      Management plans discussed with the patient, family and they are in agreement.  CODE STATUS:     Code Status Orders        Start     Ordered   10/11/15 1033  Full code  Continuous     10/11/15 1032    Code Status History    Date Active Date Inactive Code Status Order ID Comments User Context   06/27/2015  12:50 AM 06/30/2015  6:04 PM DNR [id number]  Saundra Shelling, MD ED     Advance Directive Documentation   Flowsheet Row Most Recent Value  Type of Advance Directive  Healthcare Power of Attorney  Pre-existing out of facility DNR order (yellow form or pink MOST form)  No data  "MOST" Form in Place?  No data      TOTAL TIME TAKING CARE OF THIS PATIENT: 35 minutes.    Vaughan Basta M.D on 10/14/2015 at 10:16 AM  Between 7am to 6pm - Pager - [phone]  After 6pm go to [email or web address] - password [email or web address]  Office  [phone]  CC: Primary care physician; Albina Billet, MD   Note: This dictation was prepared with Dragon dictation along with smaller phrase technology. Any transcriptional errors that result from this process are unintentional.

## 2015-10-14 NOTE — Progress Notes (Addendum)
Patient is medically stable for discharge to [facility]. Per Seth Bake admissions coordinator at [facility], patient can come to room 322. Clinical Education officer, museum (CSW) sent discharge orders to [employer] via [employer]. [place] intern met with patient at bedside and explained that patient will discharge today to [facility]. Patient verbally agreed she understood. Social work Theatre manager left patient's son, [name], a voicemail making him aware of his mothers discharge today. RN will call report to [phone] and arrange EMS for transport. Please reconsult if future social work needs arise. CSW signing off.   Patient's son [name] called CSW back and was made aware of above. [name] was in agreement with plan.   MacKenzie Rayfield. Social Work Intern  [phone]

## 2015-10-14 NOTE — Progress Notes (Signed)
Physical Therapy Treatment Patient Details Name: Traci Vaughan MRN: [id number] DOB: 1918-12-23 Today's Date: 10/14/2015    History of Present Illness Traci Vaughan  is a 82 y.o. female with a known history of Ovarian cancer in remission, macular degeneration, hypothyroidism, multiple falls at home presents from assisted living facility secondary to fall and left hip fracture. Pt underwent L hip pinning without reported post-op complications. She is POD#1 at time of initial evaluation. Pt reports 5-6 falls in the last 12 months. Pt does appear intermittently confused and it is unclear if there are innacuracies in her history.     PT Comments    Pt able to complete all supine and seated exercises as instructed during session. Demonstrates good tolerance for increased intensity. Practiced transfers with patient which appear to be improving however pt continues to struggle with TTWB on LLE. She is unable to hop in place and unable to ambulate with therapist at this time. She will need SNF placement at discharge. Pt will benefit from skilled PT services to address deficits in strength, balance, and mobility in order to return to full function at home.   Follow Up Recommendations  SNF     Equipment Recommendations  None recommended by PT (TBD at facility. Will need to use a rolling walker for mobility)    Recommendations for Other Services       Precautions / Restrictions Precautions Precautions: Fall Restrictions Weight Bearing Restrictions: Yes LLE Weight Bearing: Touchdown weight bearing    Mobility  Bed Mobility Overal bed mobility: Needs Assistance Bed Mobility: Sit to Supine     Supine to sit: Max assist Sit to supine: Mod assist   General bed mobility comments: Pt requires heavy cues for rolling and use of bed rail. Heavy assist to go from R sidelying to sitting upright at EOB. When returning to bed pt requires heavy assist for bilateral LEs  Transfers Overall transfer  level: Needs assistance Equipment used: Rolling walker (2 wheeled) Transfers: Sit to/from Stand Sit to Stand: Min assist         General transfer comment: Pt has difficulty with anterior weight shifting during transfer. Requires heavy cues and hand over hand assist. Pt attempts to lean backwards during transfers. Repeated practice of transfers performed with patient. Pt also requiring repeated cues to keep LLE WB status at TTWB.  Ambulation/Gait             General Gait Details: Unable to perform at this time   Stairs            Wheelchair Mobility    Modified Rankin (Stroke Patients Only)       Balance Overall balance assessment: Needs assistance Sitting-balance support: No upper extremity supported Sitting balance-Leahy Scale: Fair     Standing balance support: Bilateral upper extremity supported Standing balance-Leahy Scale: Poor Standing balance comment: Heavy cues for anterior weight shifitng and continual support to prevent posterior LOB                    Cognition Arousal/Alertness: Awake/alert Behavior During Therapy: WFL for tasks assessed/performed Overall Cognitive Status: No family/caregiver present to determine baseline cognitive functioning (Confused to location and situation today)                      Exercises General Exercises - Upper Extremity Shoulder Flexion: Strengthening;Both;20 reps;Seated Elbow Flexion: Strengthening;Both;20 reps;Seated Elbow Extension: Strengthening;Both;20 reps;Seated General Exercises - Lower Extremity Ankle Circles/Pumps: Both;AROM;20 reps;Supine Quad Sets: Strengthening;Both;Supine;20 reps  Gluteal Sets: Strengthening;Both;Supine;20 reps Short Arc Quad: Both;20 reps;Supine;Strengthening Long Arc Quad: Seated;Strengthening;Both;20 reps Heel Slides: Strengthening;Both;20 reps;Supine;Other (comment) (Performed x 20 bilateral in sitting) Hip ABduction/ADduction: Supine;Strengthening;Both;20  reps;Other (comment) (Performed x 20 in sitting) Straight Leg Raises: Supine;Strengthening;Both;20 reps Hip Flexion/Marching: Both;Strengthening;20 reps;Seated    General Comments        Pertinent Vitals/Pain Pain Assessment: Faces Faces Pain Scale: Hurts little more Pain Location: L hip Pain Intervention(s): Monitored during session    Home Living                      Prior Function            PT Goals (current goals can now be found in the care plan section) Acute Rehab PT Goals Patient Stated Goal: Return to prior level of function at [facility] PT Goal Formulation: With patient Time For Goal Achievement: 10/26/15 Potential to Achieve Goals: Fair Progress towards PT goals: Progressing toward goals    Frequency    BID      PT Plan Current plan remains appropriate    Co-evaluation             End of Session Equipment Utilized During Treatment: Gait belt Activity Tolerance: Patient tolerated treatment well Patient left: in bed;with bed alarm set;with call bell/phone within reach     Time: [id number] PT Time Calculation (min) (ACUTE ONLY): 40 min  Charges:  $Therapeutic Exercise: 23-37 mins $Therapeutic Activity: 8-22 mins                    G Codes:      Lyndel Safe Huprich PT, DPT   Huprich,Jason 10/14/2015, 11:38 AM

## 2015-10-14 NOTE — Progress Notes (Signed)
Patient was discharged to [facility] via EMS. IV removed with cath intact. Incont of bowel and bladder. Report called. Belongings sent with patient.

## 2015-10-14 NOTE — Progress Notes (Signed)
Subjective: 3 Days Post-Op Procedure(s) (LRB): CANNULATED HIP PINNING (Left) Patient reports pain as mild.   Patient is well, but has had some minor complaints of urinary retention Plan is to go Skilled nursing facility after hospital stay. Negative for chest pain and shortness of breath Fever: no Gastrointestinal:Negative for nausea and vomiting  Objective: Vital signs in last 24 hours: Temp:  [97.5 F (36.4 C)-98.4 F (36.9 C)] 98.4 F (36.9 C) (10/12 0450) Pulse Rate:  [82-92] 87 (10/12 0450) Resp:  [16] 16 (10/12 0450) BP: (131-158)/(60-68) 148/60 (10/12 0450) SpO2:  [90 %-94 %] 90 % (10/12 0450)  Intake/Output from previous day:  Intake/Output Summary (Last 24 hours) at 10/14/15 0827 Last data filed at 10/13/15 1733  Gross per 24 hour  Intake              500 ml  Output              150 ml  Net              350 ml    Intake/Output this shift: No intake/output data recorded.  Labs:  Recent Labs  10/11/15 0853 10/12/15 0411 10/13/15 0410  HGB 11.5* 10.3* 10.1*    Recent Labs  10/12/15 0411 10/13/15 0410  WBC 9.2 9.1  RBC 4.02 3.97  HCT 31.1* 31.4*  PLT 225 210    Recent Labs  10/12/15 0411 10/13/15 0410  NA 139 139  K 3.6 3.6  CL 107 109  CO2 25 21*  BUN 23* 28*  CREATININE 1.01* 1.33*  GLUCOSE 108* 116*  CALCIUM 8.5* 8.2*    Recent Labs  10/11/15 0853  INR 1.04     EXAM General - Patient is Alert, Appropriate and slightly lacking this AM but able to answer simple questions. Extremity - ABD soft Sensation intact distally Intact pulses distally Dorsiflexion/Plantar flexion intact Incision: scant drainage No cellulitis present Dressing/Incision - Mild bloody drainage Motor Function - intact, moving foot and toes well on exam.   Abdomen soft with normal BS, without tympany.  Past Medical History:  Diagnosis Date  . Cancer [facility]) 1984   ovarian /Dr Lottie Rater [facility]  . Macular degeneration   . Skin cancer   . Thyroid disease      Assessment/Plan: 3 Days Post-Op Procedure(s) (LRB): CANNULATED HIP PINNING (Left) Active Problems:   Hip fracture (HCC)  Estimated body mass index is 20.84 kg/m as calculated from the following:   Height as of this encounter: 5\' 4"  (1.626 m).   Weight as of this encounter: 55.1 kg (121 lb 6.4 oz). Advance diet Up with therapy   Labs not available for review this AM. Continue to work with therapy on ambulation while remaining toe-touch weightbearing. Can be discharged to SNF when medically appropriate. Pt should continue Lovenox 40mg  daily x 14 days following discharge. Toe-touch weight bearing until following up in the office with either Cameron Proud, PA-C or Dr. Roland Rack with [facility] in 4-6 weeks. Oxycodone for pain management upon discharge.  DVT Prophylaxis - Lovenox, Foot Pumps and TED hose Toe-touch Weight-Bearing to left leg  J. Cameron Proud, PA-C [facility] 10/14/2015, 8:27 AM

## 2015-10-15 DIAGNOSIS — S7292XA Unspecified fracture of left femur, initial encounter for closed fracture: Secondary | ICD-10-CM

## 2015-10-15 DIAGNOSIS — F0150 Vascular dementia without behavioral disturbance: Secondary | ICD-10-CM

## 2015-10-21 DIAGNOSIS — S8011XA Contusion of right lower leg, initial encounter: Secondary | ICD-10-CM

## 2015-10-29 DIAGNOSIS — L989 Disorder of the skin and subcutaneous tissue, unspecified: Secondary | ICD-10-CM

## 2015-10-29 DIAGNOSIS — K130 Diseases of lips: Secondary | ICD-10-CM

## 2015-11-05 DIAGNOSIS — Z8543 Personal history of malignant neoplasm of ovary: Secondary | ICD-10-CM

## 2015-11-05 DIAGNOSIS — H3530 Unspecified macular degeneration: Secondary | ICD-10-CM | POA: Diagnosis not present

## 2015-11-05 DIAGNOSIS — I10 Essential (primary) hypertension: Secondary | ICD-10-CM | POA: Diagnosis not present

## 2015-11-05 DIAGNOSIS — E039 Hypothyroidism, unspecified: Secondary | ICD-10-CM | POA: Diagnosis not present

## 2015-11-05 DIAGNOSIS — F0150 Vascular dementia without behavioral disturbance: Secondary | ICD-10-CM | POA: Diagnosis not present

## 2015-11-05 DIAGNOSIS — S72012A Unspecified intracapsular fracture of left femur, initial encounter for closed fracture: Secondary | ICD-10-CM

## 2016-11-08 IMAGING — CT CT HEAD W/O CM
3 series · 15 of 44 positions shown, 18 images · non-contrast
Comparison: 06/29/2015

CLINICAL DATA: Fall today with occipital headaches, initial
encounter

EXAM:
CT HEAD WITHOUT CONTRAST
TECHNIQUE: Contiguous axial images were obtained from the base of the skull
through the vertex without intravenous contrast.

[Series 2: head wo · axial · 0.49mm/px · z∈[-128,-18]mm · 9 of 27 slices shown, 12 images]
[im 3/27  brain]
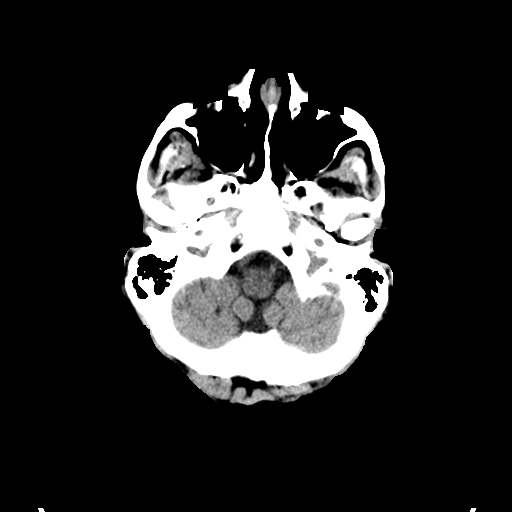
[im 3/27  bone]
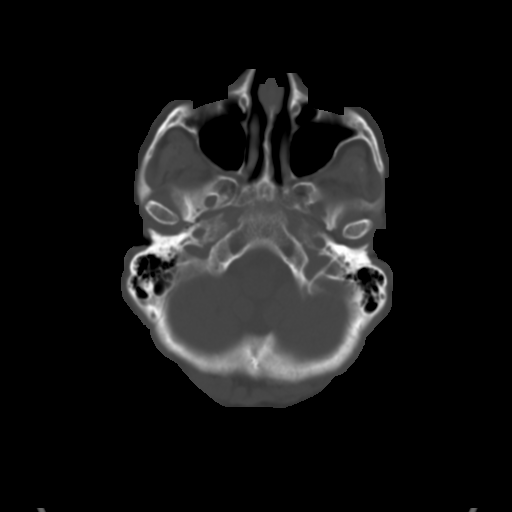
[im 6/27  brain]
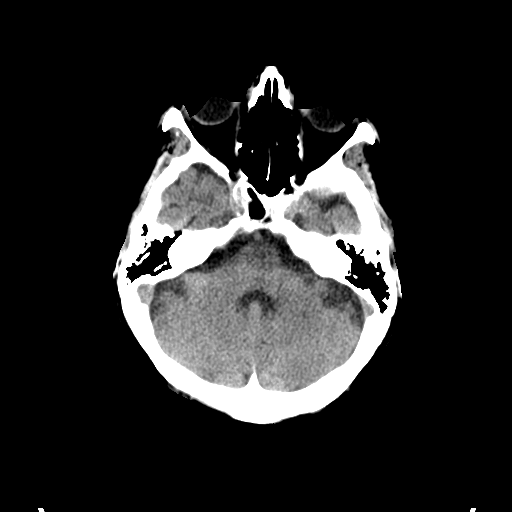
[im 8/27  brain]
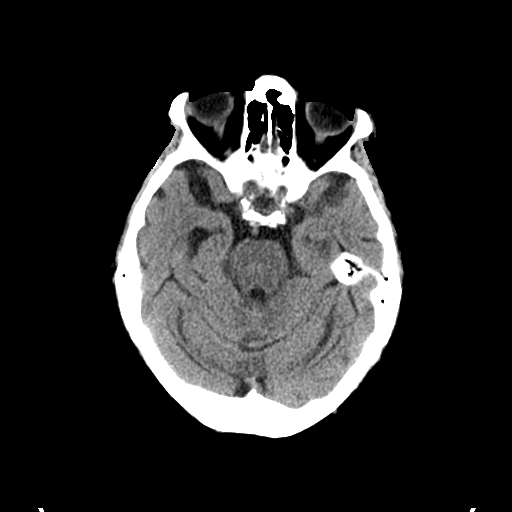
[im 11/27  brain]
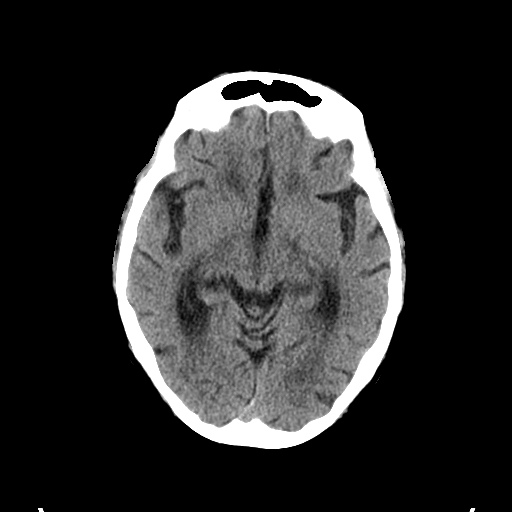
[im 14/27  brain]
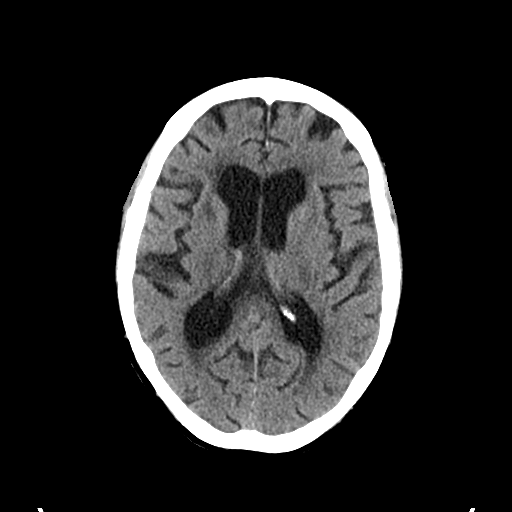
[im 14/27  bone]
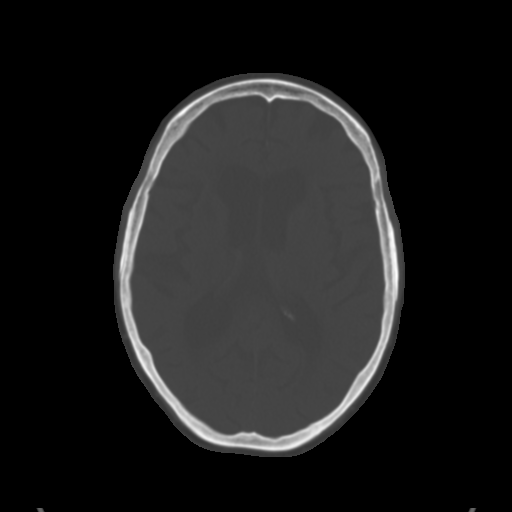
[im 17/27  brain]
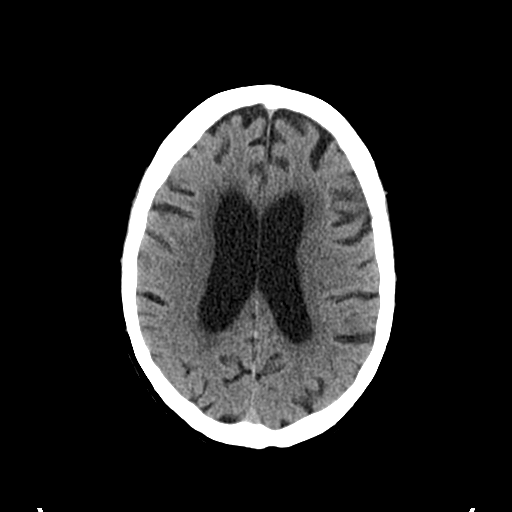
[im 20/27  brain]
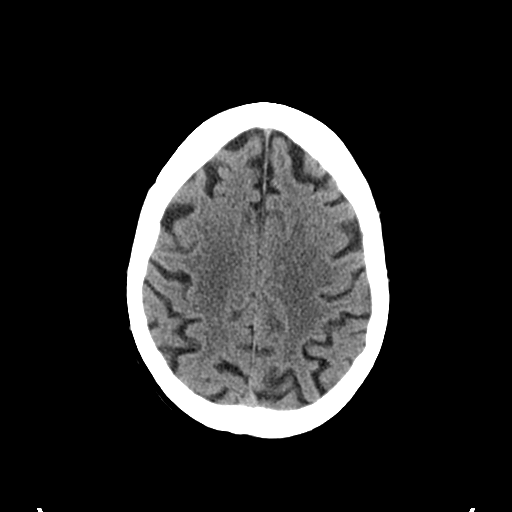
[im 22/27  brain]
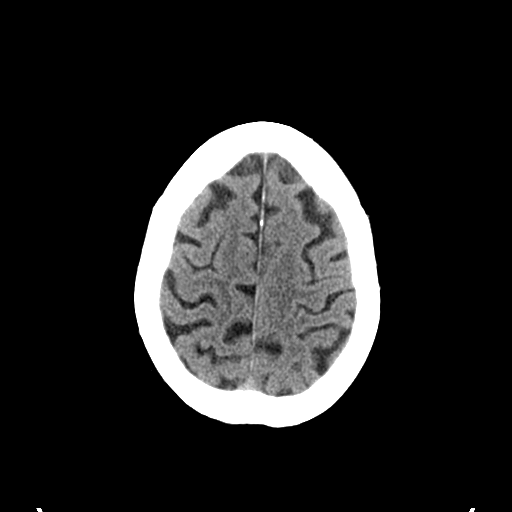
[im 25/27  brain]
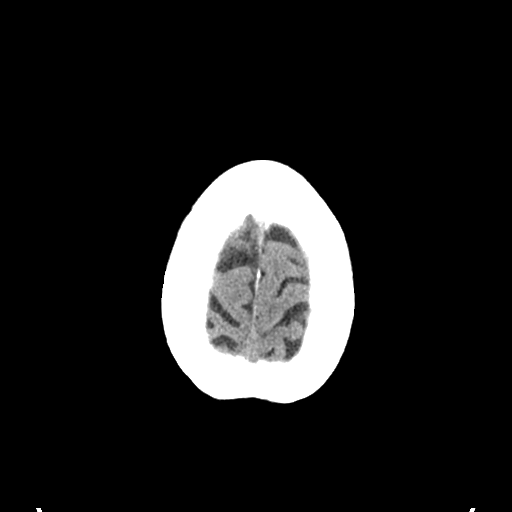
[im 25/27  bone]
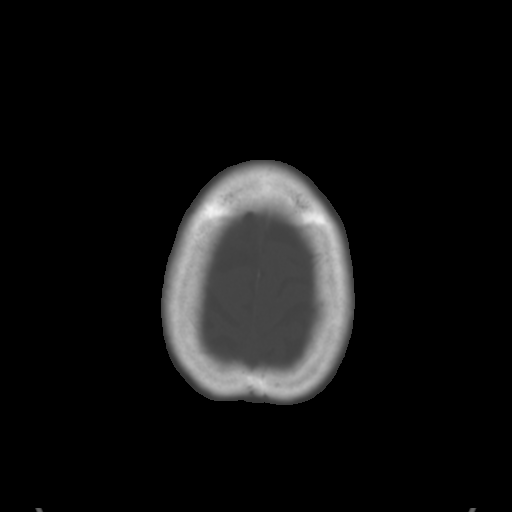

[Series 4: coronal soft tissue · coronal · 0.26mm/px · 3 of 64 slices shown]
[im 22/64  brain]
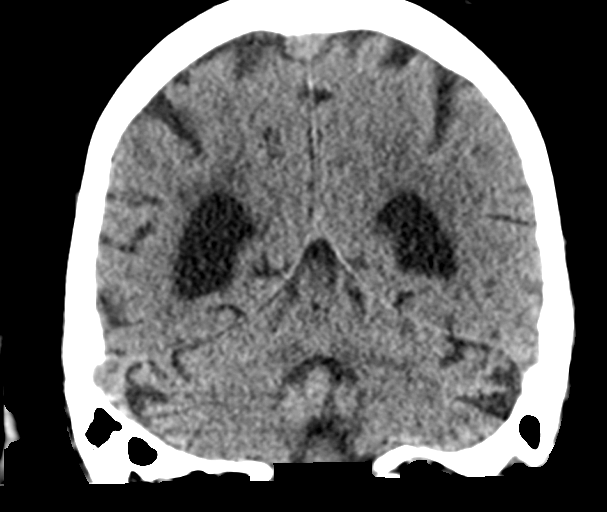
[im 29/64  brain]
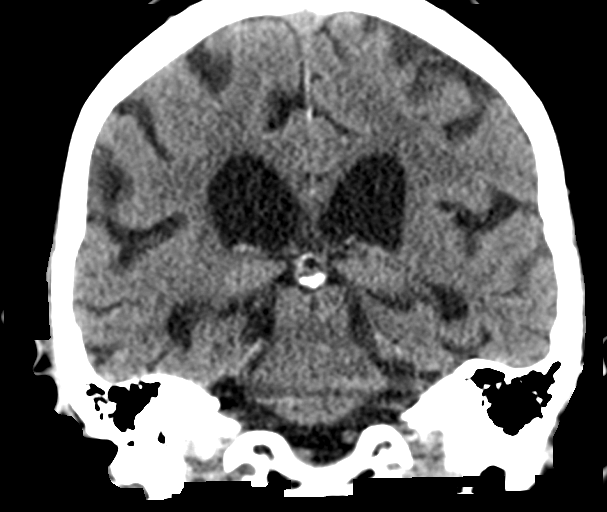
[im 36/64  brain]
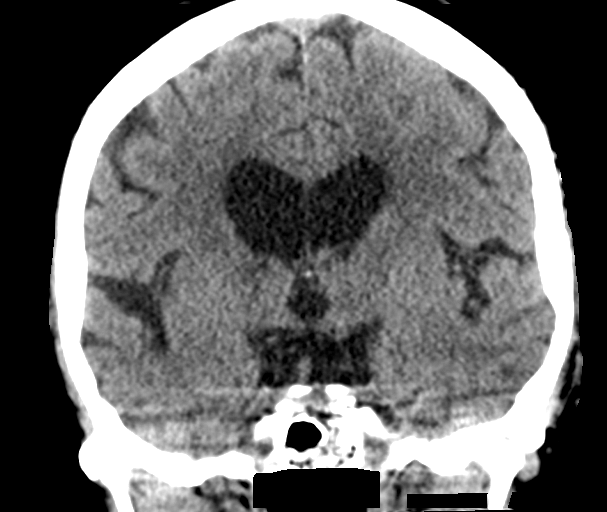

[Series 5: sagittal soft tissue · sagittal · 0.26mm/px · 3 of 50 slices shown]
[im 17/50  brain]
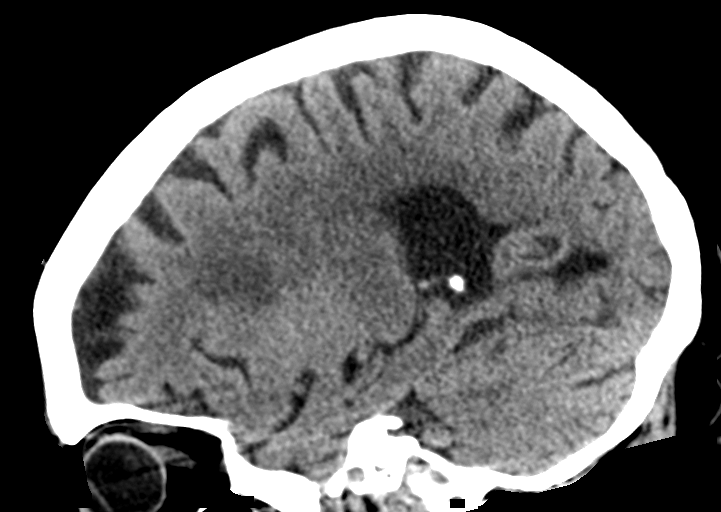
[im 25/50  brain]
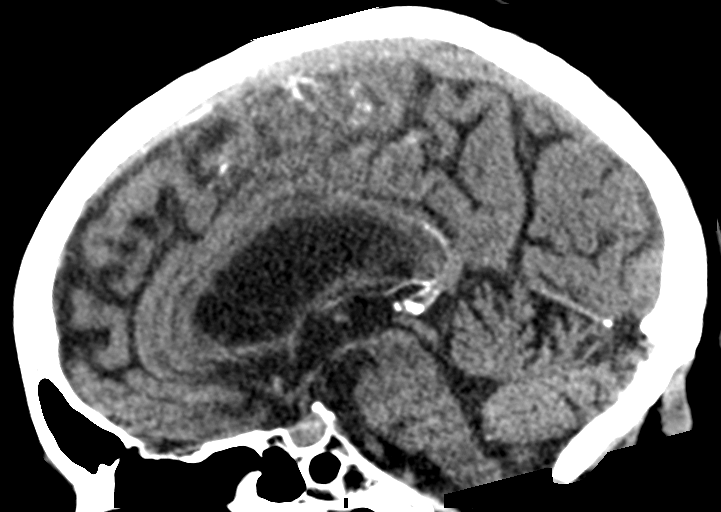
[im 33/50  brain]
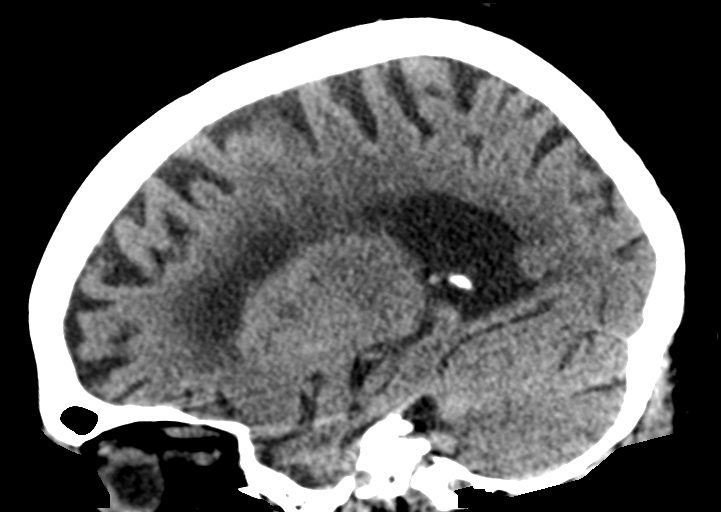

[15 of 44 positions shown; findings below may reference images not displayed]

FINDINGS: The bony calvarium is intact. Atrophic changes are seen as well as
chronic white matter ischemic change. Prior lacunar infarct is noted
within the right thalamus. No findings to suggest acute hemorrhage,
acute infarction or space-occupying mass lesion is seen.
IMPRESSION: Chronic atrophic and ischemic changes without acute abnormality.

## 2018-04-11 ENCOUNTER — Telehealth: Payer: Self-pay | Admitting: Student

## 2018-04-11 NOTE — Telephone Encounter (Signed)
NP returned call to son Traci Vaughan. Traci Vaughan states patient had episode yesterday where upon awakening, she was lethargic, difficulty keeping her eyes open and her speech was slurred. She did eat breakfast and well at dinner. Today she is much more alert, speech per baseline. Private caregiver did report patient's urine with "strong odor" this morning. We discussed her history of urinary tract infections.No other urinary symptoms reported, no fever. They are encouraged to push fluids and continue cranberry supplement. We also discussed her history of CVA, possible TIA. Son and caregivers are to continue monitor. Blood pressure yesterday was 110/70, this am 130/78. No other needs expressed. He is encouraged to call with questions/needs.

## 2018-05-03 ENCOUNTER — Telehealth: Payer: Self-pay | Admitting: Student

## 2018-05-03 NOTE — Telephone Encounter (Signed)
NP left message for son [name] to set up f/u Palliative Medicine visit.

## 2018-05-09 ENCOUNTER — Other Ambulatory Visit: Payer: Self-pay | Admitting: Student

## 2018-05-09 ENCOUNTER — Other Ambulatory Visit: Payer: Self-pay

## 2018-05-09 DIAGNOSIS — Z515 Encounter for palliative care: Secondary | ICD-10-CM

## 2018-05-09 NOTE — Progress Notes (Signed)
[place] Consult Note Telephone: [phone]  Fax: [phone]  PATIENT NAME: MARJORIA Vaughan DOB: 01-20-1918 MRN: [id number]  PRIMARY CARE PROVIDER:   Albina Billet, MD  REFERRING PROVIDER:  Albina Billet, MD [address]   [place], [place] [address]  RESPONSIBLE PARTY: Son, Domenic Schwab     ASSESSMENT: NP explained role of Palliative Medicine. Patient, son Traci Vaughan and private caregivers Barnett Applebaum and Pamala Hurry are also present. We discussed disease processes. Patient has been having TIA's and the likely hood of another CVA. Mr. Hoar would like to have patient to be able to remain in the home for as long as possible. She has 24/7 caregivers. [name] has pleasant affect. She answers questions and engages in conversation today. We discussed ongoing goals of care. We discussed symptom management. We discussed code status. Education provided on medications. We discussed risk vs. benefit of patient taking aspirin. Palliative Medicine will continue to provide support and monitor for changes/declines.     RECOMMENDATIONS and PLAN:  1.CODE STATUS. DNR is already in place, in the home. Continued discussion regarding desire for comfortable death including details regarding no heroic or extraordinary measures will be used to keep the patient alive.  2. Medical goals of therapy: At this time goals of therapy are focused on comfort and symptom management. Will continue to monitor for changes in status.  3. Symptom management: Constipation-caregiver is encouraged to increase senna-S to twice weekly. Monitor for a bowel movement at least every 3 days. History of urinary tract infections-continue cranberry supplements, encourage adequate water intake.  4. Discharge Planning: Patient will continue to reside at home with private caregivers.  5. Emotional/spiritual support: Discussed with son Traci Vaughan, caregivers Barnett Applebaum and Pamala Hurry; they are encouraged to call as  needs arise.    Palliative Medicine will follow up in 8 weeks or sooner, if needed.   I spent 40 minutes providing this consultation,  from 9:30am to 10:10am. More than 50% of the time in this consultation was spent coordinating communication.   HISTORY OF PRESENT ILLNESS:  Traci Vaughan is a 85 y.o. female with multiple medical problems including heart failure, hypertension, hyperlipidemia, hypothyroidism, dementia, macular degeneration, history of CVA, left hip fracture secondary to fall and right leg fracture. She presently lives at home with caregivers 24/7. Caregiver Barnett Applebaum and son Traci Vaughan report patient doing well today. She has had several "episodes" in the past two months where she will have facial drooping, speech becomes mumbled. She will sleep for several hours and when she awakens, is back to baseline. They report episodes occurring on mostly Wednesdays and her blood pressure is elevated; on days where she has had a TIA, her blood pressure were 180/90, 195/109, 189/84. No changes or further decline in her physical function. Hoyer lift is used for transfers. She is fed. Her appetite has been fair to good. No weight loss reported. She is sleeping well at night. No recent infections. No recent  hospitalization or ER visits.No new medications reported. She has been receiving her torsemide 20mg  1/2 a tablet (10mg ) three times a week. Caregiver states they cut the dosage in half due to patient voiding a lot on the days she takes medication. Palliative Care was asked to help address goals of care; she is seen for a follow up visit today.   CODE STATUS: DNR  PPS: 30% HOSPICE ELIGIBILITY/DIAGNOSIS: TBD  PAST MEDICAL HISTORY:  Past Medical History:  Diagnosis Date  . Cancer (  [place]) 1984   ovarian /Dr Lottie Rater [facility]  . Macular degeneration   . Skin cancer   . Thyroid disease     SOCIAL HX:  Social History   Tobacco Use  . Smoking status: Never Smoker  . Smokeless tobacco: Never Used   Substance Use Topics  . Alcohol use: No    Alcohol/week: 0.0 standard drinks    ALLERGIES:  Allergies  Allergen Reactions  . Levothyroxine Sodium Other (See Comments)       PHYSICAL EXAM:   General: NAD, frail appearing, thin Cardiovascular: regular rate and rhythm Pulmonary: clear ant fields Abdomen: soft, nontender, + bowel sounds GU: no suprapubic tenderness Extremities: no edema, no joint deformities Skin: no rashes Neurological: Weakness but otherwise nonfocal  Ezekiel Slocumb, NP

## 2018-05-23 ENCOUNTER — Telehealth: Payer: Self-pay | Admitting: Student

## 2018-05-23 NOTE — Telephone Encounter (Signed)
Phone call x 2. NP returned call to son [name]; he states patients frequencies of TIA's have increased. We discussed likelihood of patient having another CVA. We reviewed goals of care; he would like for her to be managed in the home. he denies any other changes or declines; her appetite is fair to good. She has 24/7 caregivers in the home. Her diagnoses reviewed. We discussed restarting aspirin. NP provided education on admission criteria for Hospice; patient does not appear to meet criteria at this time. NP also discussed patient with Hospice medical physician. [name] was made aware of conversation as well. Plan is for Tommy to discuss restarting aspirin with Dr. Hall Busing; risk vs. Benefits discussed. Patient will be continued to be assessed for declines and changes; will make recommendations as necessary. [name] is also aware that if patient were to have a stroke, she could be evaluated for hospice; he verbalizes understanding.

## 2018-05-24 ENCOUNTER — Telehealth: Payer: Self-pay | Admitting: Student

## 2018-05-24 NOTE — Telephone Encounter (Signed)
Palliative NP did speak with son [name] after receiving message from him earlier today regarding PCP requesting for patient to come in office or collect a clean catch specimen due to having urinary symptoms. NP had tried to call him x 2 after he had left NP a message. He states that he had spoken with NP L. Herring and she had called in an antibiotic for patient due to having UTI sx.

## 2018-05-24 NOTE — Telephone Encounter (Signed)
05/23/2018 at 2:16pm NP received call from patient's caregiver [place]. She states that patients urine was strong in odor and she thinks she may have a UTI. She was directed to notify PCP of sx. She is reminded of Palliative role being consultative; she verbalizes understanding.

## 2018-06-12 ENCOUNTER — Telehealth: Payer: Self-pay | Admitting: Student

## 2018-06-12 NOTE — Telephone Encounter (Signed)
NP received message to call caregiver Barnett Applebaum. She states that patient has not come out of her "episode" as she usually does. It has been 28-29 hours. She is not talking, she has been assisted back to bed. She states she has not eaten anything but another caregiver has reported patient eating something yesterday evening. Patient has facial drooping, she had been leaning to the left and slumped over in wheel chair, "completely limp." She is advised to obtain vital signs on patient and to let her rest in bed. Vital signs obtained per caregiver Gina, pule 52, respirations 16, blood pressure 100/70. NP advised that son will be contacted to discuss likelihood of patient having a stroke and hospice evaluation.  NP spoke with son and he agrees with Hospice evaluation. Dr. Juanell Fairly office notified with request for Hospice evaluation to be faxed. Awaiting response.

## 2018-06-12 NOTE — Telephone Encounter (Signed)
NP returned call to patient's son Traci Vaughan. He states that patient is having TIA's weekly now. He asked if it was okay to try to feed her when she is not fully awake. NP advised not to attempt feeding patient unless she is fully awake and able to swallow; he verbalizes understanding. He states he will pass this along to private caregivers. We discussed aspiration precautions. He also states that patient is not eating as well as she has been and is now spitting out some foods. NP offered visit and will measure mid arm circumference. One of the caregivers also suggested a hospital bed; NP advised that this would be optimal to aid in positioning, comfort. Appointment scheduled for 6/18; son Traci Vaughan would like to hold off on hospital bed until patient is seen in person. We also discussed advanced disease process, ongoing care plan. Son Traci Vaughan is encouraged to call with questions.

## 2018-09-03 DEATH — deceased
